# Patient Record
Sex: Male | Born: 1960 | Race: White | Hispanic: No | Marital: Single | Smoking: Never smoker
Health system: Southern US, Community
[De-identification: ages and names within clinical notes are randomized; demographics above are authoritative.]

## PROBLEM LIST (undated history)

## (undated) DIAGNOSIS — F1020 Alcohol dependence, uncomplicated: Secondary | ICD-10-CM

## (undated) HISTORY — PX: ANKLE SURGERY: SHX546

---

## 1999-12-04 ENCOUNTER — Emergency Department (HOSPITAL_COMMUNITY): Admission: EM | Admit: 1999-12-04 | Discharge: 1999-12-04 | Payer: Self-pay | Admitting: Emergency Medicine

## 1999-12-04 ENCOUNTER — Encounter: Payer: Self-pay | Admitting: Emergency Medicine

## 2000-06-26 ENCOUNTER — Encounter: Payer: Self-pay | Admitting: Emergency Medicine

## 2000-06-26 ENCOUNTER — Emergency Department (HOSPITAL_COMMUNITY): Admission: EM | Admit: 2000-06-26 | Discharge: 2000-06-26 | Payer: Self-pay | Admitting: Emergency Medicine

## 2002-03-19 ENCOUNTER — Emergency Department (HOSPITAL_COMMUNITY): Admission: EM | Admit: 2002-03-19 | Discharge: 2002-03-20 | Payer: Self-pay | Admitting: Emergency Medicine

## 2004-08-15 ENCOUNTER — Encounter: Admission: RE | Admit: 2004-08-15 | Discharge: 2004-08-15 | Payer: Self-pay | Admitting: Internal Medicine

## 2004-08-18 ENCOUNTER — Encounter: Admission: RE | Admit: 2004-08-18 | Discharge: 2004-08-18 | Payer: Self-pay | Admitting: Internal Medicine

## 2008-05-15 ENCOUNTER — Ambulatory Visit: Payer: Self-pay | Admitting: Internal Medicine

## 2008-05-15 ENCOUNTER — Ambulatory Visit (HOSPITAL_COMMUNITY): Admission: RE | Admit: 2008-05-15 | Discharge: 2008-05-15 | Payer: Self-pay | Admitting: Family Medicine

## 2008-05-28 ENCOUNTER — Ambulatory Visit: Payer: Self-pay | Admitting: Internal Medicine

## 2008-08-09 ENCOUNTER — Ambulatory Visit: Payer: Self-pay | Admitting: Internal Medicine

## 2008-08-13 ENCOUNTER — Ambulatory Visit: Payer: Self-pay | Admitting: *Deleted

## 2009-02-13 ENCOUNTER — Ambulatory Visit: Payer: Self-pay | Admitting: Internal Medicine

## 2009-02-19 ENCOUNTER — Ambulatory Visit (HOSPITAL_COMMUNITY): Admission: RE | Admit: 2009-02-19 | Discharge: 2009-02-19 | Payer: Self-pay | Admitting: Internal Medicine

## 2009-03-11 ENCOUNTER — Encounter: Admission: RE | Admit: 2009-03-11 | Discharge: 2009-05-21 | Payer: Self-pay | Admitting: Internal Medicine

## 2009-04-19 ENCOUNTER — Emergency Department (HOSPITAL_COMMUNITY): Admission: EM | Admit: 2009-04-19 | Discharge: 2009-04-19 | Payer: Self-pay | Admitting: Emergency Medicine

## 2009-05-01 ENCOUNTER — Ambulatory Visit: Payer: Self-pay | Admitting: Internal Medicine

## 2009-05-14 ENCOUNTER — Inpatient Hospital Stay (HOSPITAL_COMMUNITY): Admission: RE | Admit: 2009-05-14 | Discharge: 2009-05-15 | Payer: Self-pay | Admitting: Orthopaedic Surgery

## 2009-08-15 ENCOUNTER — Ambulatory Visit: Payer: Self-pay | Admitting: Internal Medicine

## 2009-08-23 ENCOUNTER — Encounter: Admission: RE | Admit: 2009-08-23 | Discharge: 2009-10-02 | Payer: Self-pay | Admitting: Internal Medicine

## 2009-09-13 ENCOUNTER — Ambulatory Visit: Payer: Self-pay | Admitting: Internal Medicine

## 2009-10-08 ENCOUNTER — Ambulatory Visit: Payer: Self-pay | Admitting: Internal Medicine

## 2009-10-28 ENCOUNTER — Encounter: Admission: RE | Admit: 2009-10-28 | Discharge: 2009-11-14 | Payer: Self-pay | Admitting: Internal Medicine

## 2009-12-26 ENCOUNTER — Emergency Department (HOSPITAL_COMMUNITY): Admission: EM | Admit: 2009-12-26 | Discharge: 2009-12-26 | Payer: Self-pay | Admitting: Emergency Medicine

## 2010-10-25 ENCOUNTER — Encounter: Payer: Self-pay | Admitting: Internal Medicine

## 2011-01-10 LAB — CBC
MCHC: 34.1 g/dL (ref 30.0–36.0)
MCV: 93.8 fL (ref 78.0–100.0)
Platelets: 260 10*3/uL (ref 150–400)
RBC: 4.61 MIL/uL (ref 4.22–5.81)
RDW: 15 % (ref 11.5–15.5)
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
## 2011-05-15 ENCOUNTER — Ambulatory Visit: Payer: Self-pay | Attending: Family Medicine | Admitting: Physical Therapy

## 2011-05-15 DIAGNOSIS — M25519 Pain in unspecified shoulder: Secondary | ICD-10-CM | POA: Insufficient documentation

## 2011-05-15 DIAGNOSIS — M6281 Muscle weakness (generalized): Secondary | ICD-10-CM | POA: Insufficient documentation

## 2011-05-15 DIAGNOSIS — IMO0001 Reserved for inherently not codable concepts without codable children: Secondary | ICD-10-CM | POA: Insufficient documentation

## 2011-05-15 DIAGNOSIS — M25619 Stiffness of unspecified shoulder, not elsewhere classified: Secondary | ICD-10-CM | POA: Insufficient documentation

## 2011-05-20 ENCOUNTER — Ambulatory Visit: Payer: Self-pay | Admitting: Physical Therapy

## 2011-06-02 ENCOUNTER — Encounter: Payer: Self-pay | Admitting: Physical Therapy

## 2011-06-03 ENCOUNTER — Encounter: Payer: Self-pay | Admitting: Physical Therapy

## 2011-06-09 ENCOUNTER — Encounter: Payer: Self-pay | Admitting: Physical Therapy

## 2011-06-12 ENCOUNTER — Encounter: Payer: Self-pay | Admitting: Physical Therapy

## 2011-06-13 ENCOUNTER — Emergency Department (HOSPITAL_COMMUNITY): Payer: Self-pay

## 2011-06-13 ENCOUNTER — Emergency Department (HOSPITAL_COMMUNITY)
Admission: EM | Admit: 2011-06-13 | Discharge: 2011-06-13 | Disposition: A | Discharge status: Home / Self Care | Payer: Self-pay | Attending: Emergency Medicine | Admitting: Emergency Medicine

## 2011-06-13 DIAGNOSIS — M25429 Effusion, unspecified elbow: Secondary | ICD-10-CM | POA: Insufficient documentation

## 2011-06-13 DIAGNOSIS — M25529 Pain in unspecified elbow: Secondary | ICD-10-CM | POA: Insufficient documentation

## 2011-06-13 LAB — URIC ACID: Uric Acid, Serum: 8 mg/dL — ABNORMAL HIGH (ref 4.0–7.8)

## 2011-10-26 ENCOUNTER — Ambulatory Visit (INDEPENDENT_AMBULATORY_CARE_PROVIDER_SITE_OTHER): Payer: PRIVATE HEALTH INSURANCE

## 2011-10-26 DIAGNOSIS — S7010XA Contusion of unspecified thigh, initial encounter: Secondary | ICD-10-CM

## 2011-10-26 DIAGNOSIS — M79609 Pain in unspecified limb: Secondary | ICD-10-CM

## 2013-01-05 ENCOUNTER — Encounter (HOSPITAL_COMMUNITY): Payer: Self-pay

## 2013-01-05 ENCOUNTER — Emergency Department (HOSPITAL_COMMUNITY)
Admission: EM | Admit: 2013-01-05 | Discharge: 2013-01-05 | Disposition: A | Discharge status: Home / Self Care | Payer: Self-pay | Attending: Emergency Medicine | Admitting: Emergency Medicine

## 2013-01-05 DIAGNOSIS — R040 Epistaxis: Secondary | ICD-10-CM | POA: Insufficient documentation

## 2013-01-05 NOTE — ED Notes (Signed)
No hx of trauma. No blood thinners. No bleeding at present.

## 2013-01-05 NOTE — ED Provider Notes (Signed)
History    This chart was scribed for Jeannetta Ellis, PA-C working with Loren Racer, MD by Melba Coon, ED Scribe. This patient was seen in room TR04C/TR04C and the patient's care was started at 5:20PM.   CSN: [id number]  Arrival date & time 01/05/13  1604   None     Chief Complaint  Patient presents with  . Epistaxis    (Consider location/radiation/quality/duration/timing/severity/associated sxs/prior treatment) The history is provided by the patient. No language interpreter was used.   Pedro Mitchell is a 52 y.o. male who presents to the Emergency Department complaining of episodic, moderate epistaxis with an onset yesterday around 5:30PM (about 24 hours ago). He reports a history of epistaxis about 7 years ago; he had to go the ED then and get packing; he reports it happened around allergy season in April/May when he was sneezing. Recently, he reports he was walking home around onset when his nose began bleeding and has been bleeding intermittently since yesterday and throughout the day today. He reports bleeding coming out of the nose with throat drainage. He called EMS yesterday and had something sprayed into his nose which stopped the bleeding after bleeding for 25-30 min; but he did not want EMS to bring him in at that time. He reports his nose bled 3 times today. He called EMS today and had something sprayed into his nose which stopped the bleeding after bleeding for over an hour; but he did not want EMS to bring him in at that time. However, he changed his mind and presented here to the ED. He does not present with active bleeding at this time. He denies lightheadedness, dizziness, and weakness. He denies any history of blood disorders. Denies HA, fever, neck pain, sore throat, rash, back pain, CP, SOB, abdominal pain, nausea, emesis, diarrhea, dysuria, or extremity pain, edema, weakness, numbness, or tingling. He is not on any blood thinners. No known allergies. No other  pertinent medical symptoms.  History reviewed. No pertinent past medical history.  Past Surgical History  Procedure Laterality Date  . Ankle surgery      No family history on file.  History  Substance Use Topics  . Smoking status: Never Smoker   . Smokeless tobacco: Not on file  . Alcohol Use: Yes      Review of Systems  HENT: Positive for nosebleeds.    10 Systems reviewed and all are negative for acute change except as noted in the HPI.   Allergies  Review of patient's allergies indicates no known allergies.  Home Medications  No current outpatient prescriptions on file.  BP 130/85  Pulse 84  Temp(Src) 98.1 F (36.7 C) (Oral)  Resp 18  SpO2 97%  Physical Exam  Nursing note and vitals reviewed. Constitutional: He is oriented to person, place, and time. He appears well-developed and well-nourished. No distress.  HENT:  Head: Normocephalic and atraumatic.  Mouth/Throat: Oropharynx is clear and moist. No oropharyngeal exudate.  Nose: Dried blood present at the bottom of bilateral nares. No active bleeding.  Eyes: EOM are normal. Pupils are equal, round, and reactive to light.  Neck: Normal range of motion. Neck supple. No tracheal deviation present.  Cardiovascular: Normal rate, regular rhythm and normal heart sounds.   No murmur heard. Pulmonary/Chest: Effort normal and breath sounds normal. No respiratory distress. He has no wheezes. He has no rales. He exhibits no tenderness.  Abdominal: Soft. Bowel sounds are normal. He exhibits no distension and no mass. There is no  tenderness.  Musculoskeletal: Normal range of motion.  Neurological: He is alert and oriented to person, place, and time.  Skin: Skin is warm and dry. No rash noted.  Psychiatric: He has a normal mood and affect. His behavior is normal.    ED Course  Procedures (including critical care time)  DIAGNOSTIC STUDIES: Oxygen Saturation is 97% on room air, normal by my interpretation.     COORDINATION OF CARE:  5:31PM - Will discuss case with attending physician. 5:50PM - Will refer to ENT specialist. Vitals and condition are stable at this time. He is ready for d/c.   Labs Reviewed - No data to display No results found.   1. Bleeding nose       MDM  Pt is a 52 yo M presenting for episodic anterior nosebleeds that began yesterday. No associated nausea, vomiting, headache, lightheadedness. No blood disorders, coagulopathies, or malignancies in patient history. Not actively taking blood thinners. Patient not actively bleeding in ED. PE unremarkable. No signs of posterior bleed. Patient advised to follow up with ENT referral in the next day or two for further evaluation. Given proper instructions on controlling a nose bleed. Given return precautions. Patient d/w with Dr. Ranae Palms, agrees with plan. Patient agreeable to plan. Patient is stable at time of discharge    I personally performed the services described in this documentation, which was scribed in my presence. The recorded information has been reviewed and is accurate.        Jeannetta Ellis, PA-C 01/06/13 [phone]
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
## 2013-01-06 NOTE — ED Provider Notes (Signed)
Medical screening examination/treatment/procedure(s) were performed by non-physician practitioner and as supervising physician I was immediately available for consultation/collaboration.   Shekia Kuper, MD 01/06/13 2348 

## 2014-04-02 ENCOUNTER — Emergency Department (HOSPITAL_COMMUNITY)
Admission: EM | Admit: 2014-04-02 | Discharge: 2014-04-02 | Disposition: A | Discharge status: Home / Self Care | Payer: Self-pay | Attending: Emergency Medicine | Admitting: Emergency Medicine

## 2014-04-02 ENCOUNTER — Emergency Department (HOSPITAL_COMMUNITY): Payer: Self-pay

## 2014-04-02 ENCOUNTER — Encounter (HOSPITAL_COMMUNITY): Payer: Self-pay | Admitting: Emergency Medicine

## 2014-04-02 DIAGNOSIS — Y92009 Unspecified place in unspecified non-institutional (private) residence as the place of occurrence of the external cause: Secondary | ICD-10-CM | POA: Insufficient documentation

## 2014-04-02 DIAGNOSIS — S79919A Unspecified injury of unspecified hip, initial encounter: Secondary | ICD-10-CM | POA: Insufficient documentation

## 2014-04-02 DIAGNOSIS — IMO0002 Reserved for concepts with insufficient information to code with codable children: Secondary | ICD-10-CM | POA: Insufficient documentation

## 2014-04-02 DIAGNOSIS — S79929A Unspecified injury of unspecified thigh, initial encounter: Principal | ICD-10-CM

## 2014-04-02 DIAGNOSIS — M545 Low back pain, unspecified: Secondary | ICD-10-CM

## 2014-04-02 DIAGNOSIS — R296 Repeated falls: Secondary | ICD-10-CM | POA: Insufficient documentation

## 2014-04-02 DIAGNOSIS — W19XXXA Unspecified fall, initial encounter: Secondary | ICD-10-CM

## 2014-04-02 DIAGNOSIS — Y9389 Activity, other specified: Secondary | ICD-10-CM | POA: Insufficient documentation

## 2014-04-02 DIAGNOSIS — M25552 Pain in left hip: Secondary | ICD-10-CM

## 2014-04-02 MED ORDER — METHOCARBAMOL 500 MG PO TABS: 500.0000 mg | ORAL_TABLET | Freq: Two times a day (BID) | ORAL | Status: DC

## 2014-04-02 MED ORDER — OXYCODONE-ACETAMINOPHEN 5-325 MG PO TABS: 1.0000 | ORAL_TABLET | Freq: Four times a day (QID) | ORAL | Status: DC | PRN

## 2014-04-02 MED ORDER — METHOCARBAMOL 500 MG PO TABS
500.0000 mg | ORAL_TABLET | Freq: Once | ORAL | Status: AC
  Administered 2014-04-02: 500 mg via ORAL
  Filled 2014-04-02: qty 1

## 2014-04-02 MED ORDER — OXYCODONE-ACETAMINOPHEN 5-325 MG PO TABS
2.0000 | ORAL_TABLET | Freq: Once | ORAL | Status: AC
  Administered 2014-04-02: 2 via ORAL
  Filled 2014-04-02: qty 2

## 2014-04-02 MED ORDER — NAPROXEN 500 MG PO TABS: 500.0000 mg | ORAL_TABLET | Freq: Two times a day (BID) | ORAL | Status: DC

## 2014-04-02 NOTE — Discharge Instructions (Signed)
Take muscle relaxer and pain medication as needed.  Do not drive or operate heavy machinery for 4-6 hours after taking medication.

## 2014-04-02 NOTE — ED Provider Notes (Signed)
CSN: [id number]     Arrival date & time 04/02/14  0906 History  This chart was scribed for a non-physician practitioner, Santiago GladHeather Laisure PA,, working with Merrie RoofJohn David Wofford III, * by Julian HyMorgan Graham, ED Scribe. [facility] The patient's care was started at 9:57 AM.   Chief Complaint  Patient presents with  . Hip Pain    The history is provided by the patient. No language interpreter was used.   HPI Comments: Pedro Mitchell is a 53 y.o. male who presents to the Emergency Department complaining of constant, unchanged left hip pain resulting from a fall that occurred 8 days ago. Patient states he fell from his porch, about 1 foot off of the ground, into some bushes. Patient states he landed on his left side and noticed bruising appear the following day. He complains of constant, non-radiating left hip pain with intermittent episodes of sharp, "catching" pains. He states the pain is worse with movement and ambulation. He has taken Tylenol with minimal relief. Patient denies hematuria, vomiting, or abdominal pain.  He is not on any anticoagulants.  History reviewed. No pertinent past medical history. Past Surgical History  Procedure Laterality Date  . Ankle surgery     No family history on file. History  Substance Use Topics  . Smoking status: Never Smoker   . Smokeless tobacco: Not on file  . Alcohol Use: Yes    Review of Systems  Respiratory: Negative for shortness of breath.   Gastrointestinal: Negative for nausea, vomiting and abdominal pain.  Genitourinary: Negative for hematuria.  Musculoskeletal: Positive for arthralgias (left hip) and back pain.  Skin: Positive for wound (bruising to back).  Neurological: Negative for weakness and numbness.  All other systems reviewed and are negative.  Allergies  Review of patient's allergies indicates no known allergies.  Home Medications   Prior to Admission medications   Medication Sig Start Date End Date  Taking? Authorizing Rifky Lapre  acetaminophen (TYLENOL) 325 MG tablet Take 650 mg by mouth every 6 (six) hours as needed for mild pain.   Yes Historical Milik Gilreath, MD   Triage Vitals: BP 119/87  Pulse 69  Temp(Src) 97.5 F (36.4 C) (Oral)  Resp 18  Ht 6\' 5"  (1.956 m)  Wt 195 lb (88.451 kg)  BMI 23.12 kg/m2  SpO2 100% Physical Exam  Nursing note and vitals reviewed. Constitutional: He is oriented to person, place, and time. He appears well-developed and well-nourished. No distress.  HENT:  Head: Normocephalic and atraumatic.  Eyes: Conjunctivae and EOM are normal.  Neck: Neck supple. No tracheal deviation present.  Cardiovascular: Normal rate, regular rhythm and normal heart sounds.   Pulses:      Dorsalis pedis pulses are 2+ on the right side.  2+ DP pulse bilaterally  Pulmonary/Chest: Effort normal and breath sounds normal. No respiratory distress.  No ecchymosis to the chest.    Abdominal: Soft. There is no tenderness.  No ecchymosis to the abdomen.   Musculoskeletal:       Left hip: He exhibits decreased range of motion and tenderness.       Lumbar back: He exhibits tenderness.  Ecchomisis of left upper, mid, and lower back. Tenderness to palpatation over these areas. No hematoma present. Tenderness to palpation to lumbar spine. No step offs or deformities. Distal sensation of both feet is intact. Patient ambulates with a limp favoring the right side.   Pain with flexion, extension, adduction, abduction of left hip. No obvious ecchymosis or  swelling of left hip. Tenderness to palpation of left hip.   Neurological: He is alert and oriented to person, place, and time.  Skin: Skin is warm and dry.  Psychiatric: He has a normal mood and affect. His behavior is normal.    ED Course  Procedures (including critical care time)  DIAGNOSTIC STUDIES: Oxygen Saturation is 100% on room air, normal by my interpretation.    COORDINATION OF CARE: 10:00 AM- Will order x-rays of left hip  and lumbar spine. Patient informed of current plan for treatment and evaluation and agrees with plan at this time.  Labs Review Labs Reviewed - No data to display  Imaging Review Dg Lumbar Spine Complete  04/02/2014   CLINICAL DATA:  Fall 1 week ago.  Low back pain.  EXAM: LUMBAR SPINE - COMPLETE 4+ VIEW  COMPARISON:  Plain films lumbar spine 12/26/2009.  FINDINGS: There is no evidence of lumbar spine fracture. Alignment is normal. Intervertebral disc spaces are maintained.  IMPRESSION: Negative exam.   Electronically Signed   By: Drusilla Kannerhomas  Dalessio M.D.   On: 04/02/2014 10:56   Dg Hip Complete Left  04/02/2014   CLINICAL DATA:  Status post fall 1 week ago.  Left hip pain.  EXAM: LEFT HIP - COMPLETE 2+ VIEW  COMPARISON:  None.  FINDINGS: No acute bony or joint abnormality is identified. Spurring at the lesser trochanter is incidentally noted. No notable degenerative change about the hips is seen.  IMPRESSION: No acute abnormality.   Electronically Signed   By: Drusilla Kannerhomas  Dalessio M.D.   On: 04/02/2014 10:55     EKG Interpretation None     11:10 AM Patient reports that his pain has significantly improved.   MDM   Final diagnoses:  None   Patient presenting with pain of his left hip and back that has been present since falling 8 days ago.  Patient hemodynamically stable.  Xrays negative.  Patient is ambulatory.  Feel that the patient is stable for discharge.  Return precautions given.  I personally performed the services described in this documentation, which was scribed in my presence. The recorded information has been reviewed and is accurate.     Santiago GladHeather Laisure, PA-C 04/03/14 2353

## 2014-04-02 NOTE — ED Notes (Signed)
Patient states he slipped off porch into some bushes approximately 8 days ago.  Patient complains of L hip pain.   Patient states he has been walking on the hip since accident.   Patient also has significant amount of bruising to L back.

## 2014-04-03 NOTE — Discharge Planning (Signed)
[facility] Liaison was not able to see patient, [facility] orange card information and primary care resource guide will be mailed to the address listed °

## 2014-04-04 NOTE — ED Provider Notes (Signed)
Medical screening examination/treatment/procedure(s) were performed by non-physician practitioner and as supervising physician I was immediately available for consultation/collaboration.   John David Wofford III, MD 04/04/14 1037 

## 2014-07-23 ENCOUNTER — Ambulatory Visit: Payer: Self-pay

## 2014-08-22 ENCOUNTER — Ambulatory Visit: Payer: Self-pay

## 2019-05-19 ENCOUNTER — Emergency Department (HOSPITAL_COMMUNITY)
Admission: EM | Admit: 2019-05-19 | Discharge: 2019-05-19 | Disposition: A | Discharge status: Home / Self Care | Payer: Self-pay | Attending: Emergency Medicine | Admitting: Emergency Medicine

## 2019-05-19 ENCOUNTER — Other Ambulatory Visit: Payer: Self-pay

## 2019-05-19 ENCOUNTER — Emergency Department (HOSPITAL_COMMUNITY): Payer: Self-pay

## 2019-05-19 DIAGNOSIS — Y904 Blood alcohol level of 80-99 mg/100 ml: Secondary | ICD-10-CM | POA: Insufficient documentation

## 2019-05-19 DIAGNOSIS — S52022A Displaced fracture of olecranon process without intraarticular extension of left ulna, initial encounter for closed fracture: Secondary | ICD-10-CM

## 2019-05-19 DIAGNOSIS — Y999 Unspecified external cause status: Secondary | ICD-10-CM | POA: Insufficient documentation

## 2019-05-19 DIAGNOSIS — F1010 Alcohol abuse, uncomplicated: Secondary | ICD-10-CM

## 2019-05-19 DIAGNOSIS — Z23 Encounter for immunization: Secondary | ICD-10-CM | POA: Insufficient documentation

## 2019-05-19 DIAGNOSIS — S5002XA Contusion of left elbow, initial encounter: Secondary | ICD-10-CM

## 2019-05-19 DIAGNOSIS — Y92811 Bus as the place of occurrence of the external cause: Secondary | ICD-10-CM | POA: Insufficient documentation

## 2019-05-19 DIAGNOSIS — Y9389 Activity, other specified: Secondary | ICD-10-CM | POA: Insufficient documentation

## 2019-05-19 DIAGNOSIS — W19XXXA Unspecified fall, initial encounter: Secondary | ICD-10-CM

## 2019-05-19 DIAGNOSIS — S80211A Abrasion, right knee, initial encounter: Secondary | ICD-10-CM | POA: Insufficient documentation

## 2019-05-19 DIAGNOSIS — Z79899 Other long term (current) drug therapy: Secondary | ICD-10-CM | POA: Insufficient documentation

## 2019-05-19 LAB — CBC WITH DIFFERENTIAL/PLATELET
Abs Immature Granulocytes: 0 10*3/uL (ref 0.00–0.07)
Basophils Absolute: 0 10*3/uL (ref 0.0–0.1)
Basophils Relative: 0 %
Eosinophils Absolute: 0 10*3/uL (ref 0.0–0.5)
Eosinophils Relative: 0 %
HCT: 35.7 % — ABNORMAL LOW (ref 39.0–52.0)
Hemoglobin: 12 g/dL — ABNORMAL LOW (ref 13.0–17.0)
Lymphocytes Relative: 12 %
Lymphs Abs: 1.6 10*3/uL (ref 0.7–4.0)
MCH: 32.3 pg (ref 26.0–34.0)
MCHC: 33.6 g/dL (ref 30.0–36.0)
MCV: 96 fL (ref 80.0–100.0)
Monocytes Absolute: 3.4 10*3/uL — ABNORMAL HIGH (ref 0.1–1.0)
Monocytes Relative: 25 %
Neutro Abs: 8.5 10*3/uL — ABNORMAL HIGH (ref 1.7–7.7)
Neutrophils Relative %: 63 %
Platelets: 130 10*3/uL — ABNORMAL LOW (ref 150–400)
RBC: 3.72 MIL/uL — ABNORMAL LOW (ref 4.22–5.81)
RDW: 12.7 % (ref 11.5–15.5)
WBC: 13.5 10*3/uL — ABNORMAL HIGH (ref 4.0–10.5)
nRBC: 0 % (ref 0.0–0.2)
nRBC: 0 /100 WBC

## 2019-05-19 LAB — PROTIME-INR
INR: 1.1 (ref 0.8–1.2)
Prothrombin Time: 14.3 seconds (ref 11.4–15.2)

## 2019-05-19 LAB — COMPREHENSIVE METABOLIC PANEL
ALT: 79 U/L — ABNORMAL HIGH (ref 0–44)
AST: 184 U/L — ABNORMAL HIGH (ref 15–41)
Albumin: 4.7 g/dL (ref 3.5–5.0)
Alkaline Phosphatase: 81 U/L (ref 38–126)
Anion gap: 18 — ABNORMAL HIGH (ref 5–15)
BUN: <5 mg/dL — ABNORMAL LOW (ref 6–20)
CO2: 20 mmol/L — ABNORMAL LOW (ref 22–32)
Calcium: 9.5 mg/dL (ref 8.9–10.3)
Chloride: 93 mmol/L — ABNORMAL LOW (ref 98–111)
Creatinine, Ser: 0.84 mg/dL (ref 0.61–1.24)
GFR calc Af Amer: >60 mL/min (ref >60)
GFR calc non Af Amer: >60 mL/min (ref >60)
Glucose, Bld: 109 mg/dL — ABNORMAL HIGH (ref 70–99)
Potassium: 4 mmol/L (ref 3.5–5.1)
Sodium: 131 mmol/L — ABNORMAL LOW (ref 135–145)
Total Bilirubin: 1.6 mg/dL — ABNORMAL HIGH (ref 0.3–1.2)
Total Protein: 8.1 g/dL (ref 6.5–8.1)

## 2019-05-19 LAB — CBG MONITORING, ED: Glucose-Capillary: 133 mg/dL — ABNORMAL HIGH (ref 70–99)

## 2019-05-19 LAB — ETHANOL: Alcohol, Ethyl (B): 80 mg/dL — ABNORMAL HIGH (ref <10)

## 2019-05-19 MED ORDER — LORAZEPAM 1 MG PO TABS
0.0000 mg | ORAL_TABLET | Freq: Four times a day (QID) | ORAL | Status: DC
  Administered 2019-05-19: 19:00:00 1 mg via ORAL
  Filled 2019-05-19: qty 1

## 2019-05-19 MED ORDER — THIAMINE HCL 100 MG/ML IJ SOLN
100.0000 mg | Freq: Every day | INTRAMUSCULAR | Status: DC
  Filled 2019-05-19: qty 2

## 2019-05-19 MED ORDER — VITAMIN B-1 100 MG PO TABS
100.0000 mg | ORAL_TABLET | Freq: Every day | ORAL | Status: DC
  Administered 2019-05-19: 100 mg via ORAL
  Filled 2019-05-19: qty 1

## 2019-05-19 MED ORDER — LORAZEPAM 1 MG PO TABS: 0.0000 mg | ORAL_TABLET | Freq: Two times a day (BID) | ORAL | Status: DC

## 2019-05-19 MED ORDER — CEPHALEXIN 500 MG PO CAPS: 500.0000 mg | ORAL_CAPSULE | Freq: Four times a day (QID) | ORAL | 0 refills | Status: DC

## 2019-05-19 MED ORDER — CEFAZOLIN SODIUM-DEXTROSE 1-4 GM/50ML-% IV SOLN
1.0000 g | Freq: Once | INTRAVENOUS | Status: AC
  Administered 2019-05-19: 1 g via INTRAVENOUS
  Filled 2019-05-19: qty 50

## 2019-05-19 MED ORDER — SODIUM CHLORIDE 0.9 % IV BOLUS: 1000.0000 mL | Freq: Once | INTRAVENOUS | Status: DC

## 2019-05-19 MED ORDER — OXYCODONE-ACETAMINOPHEN 5-325 MG PO TABS
1.0000 | ORAL_TABLET | Freq: Once | ORAL | Status: AC
  Administered 2019-05-19: 1 via ORAL
  Filled 2019-05-19: qty 1

## 2019-05-19 MED ORDER — MORPHINE SULFATE (PF) 4 MG/ML IV SOLN
4.0000 mg | Freq: Once | INTRAVENOUS | Status: DC
  Filled 2019-05-19: qty 1

## 2019-05-19 MED ORDER — HYDROCODONE-ACETAMINOPHEN 5-325 MG PO TABS: 1.0000 | ORAL_TABLET | Freq: Four times a day (QID) | ORAL | 0 refills | Status: DC | PRN

## 2019-05-19 MED ORDER — LORAZEPAM 2 MG/ML IJ SOLN
0.0000 mg | Freq: Four times a day (QID) | INTRAMUSCULAR | Status: DC
  Filled 2019-05-19: qty 1

## 2019-05-19 MED ORDER — LORAZEPAM 1 MG PO TABS
1.0000 mg | ORAL_TABLET | Freq: Once | ORAL | Status: AC
  Administered 2019-05-19: 23:00:00 1 mg via ORAL
  Filled 2019-05-19: qty 1

## 2019-05-19 MED ORDER — TETANUS-DIPHTH-ACELL PERTUSSIS 5-2.5-18.5 LF-MCG/0.5 IM SUSP
0.5000 mL | Freq: Once | INTRAMUSCULAR | Status: AC
  Administered 2019-05-19: 21:00:00 0.5 mL via INTRAMUSCULAR
  Filled 2019-05-19: qty 0.5

## 2019-05-19 MED ORDER — IBUPROFEN 600 MG PO TABS: 600.0000 mg | ORAL_TABLET | Freq: Four times a day (QID) | ORAL | 0 refills | Status: DC | PRN

## 2019-05-19 MED ORDER — ONDANSETRON HCL 4 MG/2ML IJ SOLN
4.0000 mg | Freq: Once | INTRAMUSCULAR | Status: DC
  Filled 2019-05-19: qty 2

## 2019-05-19 MED ORDER — LORAZEPAM 2 MG/ML IJ SOLN: 0.0000 mg | Freq: Two times a day (BID) | INTRAMUSCULAR | Status: DC
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
## 2019-05-19 NOTE — Discharge Instructions (Signed)
Please take antibiotics as directed to help prevent skin infection over your abrasion.  Use ibuprofen every 6 hours for pain, Norco as needed for additional pain.  It is extremely important that you elevate your elbow as much as possible to help reduce swelling, you can apply ice as well.  Please call Monday morning to schedule follow-up with Dr. Marcelino Scot with orthopedics.  If your hand becomes numb, discolored you have significantly worsening pain please return to the emergency department.

## 2019-05-19 NOTE — ED Notes (Signed)
BS 132

## 2019-05-19 NOTE — ED Notes (Signed)
IV team here to start IV 

## 2019-05-19 NOTE — ED Provider Notes (Signed)
[name]   CSN: [id number] Arrival date & time: 05/19/19  1423    History   Chief Complaint Chief Complaint  Patient presents with   Arm Injury   Leg Injury    HPI Andre LefortKeenan [facility] is a 58 y.o. male.     Andre LefortKeenan [facility] is a 58 y.o. male with a history of alcohol abuse, otherwise healthy, who presents to the emergency department for evaluation of elbow and right knee injury.  Patient reports yesterday while getting off of a public transportation bus he missed a step and fell striking his left elbow on the ground and landing on his right knee.  Since fall yesterday patient has had significant swelling and hematoma to the left elbow.  He reports severe pain with movement of his left elbow.  He has an abrasion to the left elbow but no open laceration.  Denies any bleeding.  He reports swelling has extended into his left forearm, he denies any numbness or tingling.  Able to move fingers.  He also reports an abrasion and bruise to his right knee although reports he has been ambulatory on the knee with mild discomfort.  Patient is not on any blood thinners, denies any previous issues with bruising or bleeding.  Does report daily alcohol use, typically drinks a sixpack every day, has not had anything to drink since last night.  Did not hit his head during fall, no neck or back pain.  No other injuries.     No past medical history on file.  There are no active problems to display for this patient.   Past Surgical History:  Procedure Laterality Date   ANKLE SURGERY          Home Medications    Prior to Admission medications   Medication Sig Start Date End Date Taking? Authorizing Provider  acetaminophen (TYLENOL) 325 MG tablet Take 650 mg by mouth every 6 (six) hours as needed for mild pain.    [provider]  cephALEXin (KEFLEX) 500 MG capsule Take 1 capsule (500 mg total) by mouth 4 (four) times daily. 05/19/19    Dartha LodgeFord, Tira Lafferty N, PA-C  HYDROcodone-acetaminophen (NORCO) 5-325 MG tablet Take 1 tablet by mouth every 6 (six) hours as needed. 05/19/19   Dartha LodgeFord, Ajamu Maxon N, PA-C  ibuprofen (ADVIL) 600 MG tablet Take 1 tablet (600 mg total) by mouth every 6 (six) hours as needed. 05/19/19   Dartha LodgeFord, Henrique Parekh N, PA-C  methocarbamol (ROBAXIN) 500 MG tablet Take 1 tablet (500 mg total) by mouth 2 (two) times daily. 04/02/14   Santiago GladLaisure, Heather, PA-C  naproxen (NAPROSYN) 500 MG tablet Take 1 tablet (500 mg total) by mouth 2 (two) times daily. 04/02/14   Santiago GladLaisure, Heather, PA-C  oxyCODONE-acetaminophen (PERCOCET/ROXICET) 5-325 MG per tablet Take 1-2 tablets by mouth every 6 (six) hours as needed for severe pain. 04/02/14   Santiago GladLaisure, Heather, PA-C    Family History No family history on file.  Social History Social History   Tobacco Use   Smoking status: Never Smoker  Substance Use Topics   Alcohol use: Yes   Drug use: No     Allergies   Patient has no known allergies.   Review of Systems Review of Systems  Constitutional: Negative for chills and fever.  HENT: Negative.   Respiratory: Negative for cough and shortness of breath.   Cardiovascular: Negative for chest pain.  Gastrointestinal: Negative for abdominal pain, nausea and vomiting.  Musculoskeletal: Positive for  arthralgias and joint swelling. Negative for myalgias.  Skin: Positive for wound. Negative for color change and rash.  Neurological: Negative for dizziness, syncope, weakness, light-headedness and numbness.     Physical Exam Updated Vital Signs BP (!) 165/95    Pulse (!) 105    Temp 98.3 F (36.8 C) (Oral)    Resp 18    SpO2 100%   Physical Exam Vitals signs and nursing note reviewed.  Constitutional:      General: He is not in acute distress.    Appearance: Normal appearance. He is well-developed and normal weight. He is not ill-appearing or diaphoretic.     Comments: Patient is alert, mildly tremulous but able to answer all questions  appropriately.  HENT:     Head: Normocephalic and atraumatic.     Comments: No evidence of head trauma, no palpable hematoma, ecchymosis, abrasion or appreciable step-off or deformity. Eyes:     General:        Right eye: No discharge.        Left eye: No discharge.  Neck:     Musculoskeletal: Neck supple.     Comments: No midline C-spine tenderness. Cardiovascular:     Rate and Rhythm: Regular rhythm. Tachycardia present.     Heart sounds: Normal heart sounds. No murmur. No friction rub. No gallop.   Pulmonary:     Effort: Pulmonary effort is normal. No respiratory distress.     Comments: Respirations equal and unlabored, patient able to speak in full sentences, lungs clear to auscultation bilaterally, no tenderness over chest wall Abdominal:     General: Abdomen is flat. Bowel sounds are normal. There is no distension.     Palpations: Abdomen is soft. There is no mass.     Tenderness: There is no abdominal tenderness. There is no guarding.     Comments: Abdomen soft, no ecchymosis, nontender to palpation.  Musculoskeletal:     Comments: Left elbow with significant hematoma and swelling extending up to the mid upper arm and to the mid forearm.  There is an abrasion over the olecranon of the elbow but there is no open laceration.  Bruising over the olecranon and surrounding posterior elbow.  2+ radial pulse, 5/5 grip strength and cardinal hand movements intact.  Normal sensation throughout the left upper extremity.  No tenderness swelling or pain at the shoulder or wrist. Right knee with some tenderness, abrasion and ecchymosis but no swelling or palpable bony deformity. Distal pulses intact, ambulatory without difficulty.  Skin:    General: Skin is warm and dry.  Neurological:     Mental Status: He is alert and oriented to person, place, and time.     Coordination: Coordination normal.  Psychiatric:        Mood and Affect: Mood normal.        Behavior: Behavior normal.      ED  Treatments / Results  Labs (all labs ordered are listed, but only abnormal results are displayed) Labs Reviewed  COMPREHENSIVE METABOLIC PANEL - Abnormal; Notable for the following components:      Result Value   Sodium 131 (*)    Chloride 93 (*)    CO2 20 (*)    Glucose, Bld 109 (*)    BUN <5 (*)    AST 184 (*)    ALT 79 (*)    Total Bilirubin 1.6 (*)    Anion gap 18 (*)    All other components within normal limits  CBC WITH  DIFFERENTIAL/PLATELET - Abnormal; Notable for the following components:   WBC 13.5 (*)    RBC 3.72 (*)    Hemoglobin 12.0 (*)    HCT 35.7 (*)    Platelets 130 (*)    Neutro Abs 8.5 (*)    Monocytes Absolute 3.4 (*)    All other components within normal limits  ETHANOL - Abnormal; Notable for the following components:   Alcohol, Ethyl (B) 80 (*)    All other components within normal limits  CBG MONITORING, ED - Abnormal; Notable for the following components:   Glucose-Capillary 133 (*)    All other components within normal limits  PROTIME-INR    EKG None  Radiology Dg Elbow Complete Left  Result Date: 05/19/2019 CLINICAL DATA:  Pain after fall EXAM: LEFT ELBOW - COMPLETE 3+ VIEW COMPARISON:  None. FINDINGS: There is a displaced fracture through the olecranon. A resulting joint effusion is identified. Degenerative changes are seen in the elbow. No other abnormalities. IMPRESSION: Displaced olecranon fracture with a joint effusion and overlying soft tissue swelling. Electronically Signed   By: Gerome Sam III M.D   On: 05/19/2019 17:00   Dg Forearm Left  Result Date: 05/19/2019 CLINICAL DATA:  Pain after fall EXAM: LEFT FOREARM - 2 VIEW COMPARISON:  None. FINDINGS: There is a displaced fracture through the olecranon. No other fractures. IMPRESSION: Displaced fracture through the olecranon.  No other abnormalities. Electronically Signed   By: Gerome Sam III M.D   On: 05/19/2019 17:03   Ct Elbow Left Wo Contrast  Result Date:  05/19/2019 CLINICAL DATA:  Elbow fracture EXAM: CT OF THE UPPER LEFT EXTREMITY WITHOUT CONTRAST TECHNIQUE: Multidetector CT imaging of the upper left extremity was performed according to the standard protocol. COMPARISON:  Radiograph same day. FINDINGS: Bones/Joint/Cartilage There is comminuted fracture seen through the olecranon process with intra-articular extension seen through the posterior on the trochlear articulation. There is destruction of the electron process approximately 1.3 cm. There is also comminuted minimally displaced fracture fragment seen at the anterior coronoid process. A portion of the ulna still articulates with the trochlea. The radial head still articulates with the capitellum. Ligaments Suboptimally assessed by CT. Muscles and Tendons There are well corticated ossicle seen at the insertion site of the common flexor tendon, likely calcific tendinosis. There is heterogeneous signal seen at the insertion site of the triceps tendon. The muscles appear to be grossly intact. Soft tissues A moderate elbow joint effusion is seen. There is diffuse subcutaneous edema seen surrounding the posterior elbow. IMPRESSION: 1. Comminuted displaced fracture involving the olecranon process with intra-articular extension of the ulnotrochlear articulation. There is also comminuted fractures of the anterior coronoid process. 2. Heterogeneous signal seen within the triceps insertion which could be partial disruption. 3. Calcific tendinosis of the common flexor tendon. 4. Elbow joint effusion Electronically Signed   By: Jonna Clark M.D.   On: 05/19/2019 22:50   Dg Knee Complete 4 Views Right  Result Date: 05/19/2019 CLINICAL DATA:  Pain after fall EXAM: RIGHT KNEE - COMPLETE 4+ VIEW COMPARISON:  None. FINDINGS: No evidence of fracture, dislocation, or joint effusion. No evidence of arthropathy or other focal bone abnormality. Soft tissues are unremarkable. IMPRESSION: Negative. Electronically Signed   By:  Gerome Sam III M.D   On: 05/19/2019 17:01    Procedures Procedures (including critical care time)  Medications Ordered in ED Medications  sodium chloride 0.9 % bolus 1,000 mL (has no administration in time range)  ondansetron (ZOFRAN) injection 4  mg (has no administration in time range)  LORazepam (ATIVAN) injection 0-4 mg ( Intravenous See Alternative 05/19/19 1853)    Or  LORazepam (ATIVAN) tablet 0-4 mg (1 mg Oral Given 05/19/19 1853)  LORazepam (ATIVAN) injection 0-4 mg (has no administration in time range)    Or  LORazepam (ATIVAN) tablet 0-4 mg (has no administration in time range)  thiamine (VITAMIN B-1) tablet 100 mg (100 mg Oral Given 05/19/19 1851)    Or  thiamine (B-1) injection 100 mg ( Intravenous See Alternative 05/19/19 1851)  oxyCODONE-acetaminophen (PERCOCET/ROXICET) 5-325 MG per tablet 1 tablet (1 tablet Oral Given 05/19/19 1855)  Tdap (BOOSTRIX) injection 0.5 mL (0.5 mLs Intramuscular Given 05/19/19 2106)  ceFAZolin (ANCEF) IVPB 1 g/50 mL premix (0 g Intravenous Stopped 05/19/19 2221)  LORazepam (ATIVAN) tablet 1 mg (1 mg Oral Given 05/19/19 2240)     Initial Impression / Assessment and Plan / ED Course  I have reviewed the triage vital signs and the nursing notes.  Pertinent labs & imaging results that were available during my care of the patient were reviewed by me and considered in my medical decision making (see chart for details).  58 year old male presents after fall yesterday with significant swelling and hematoma to the left elbow as well as some bruising and pain to the right knee.  On arrival patient is tachycardic and mildly hypertensive vitals otherwise stable, he is mildly tremulous but is alert and responsive.  Does report that he is a daily drinker and has not had anything to drink since yesterday, suspect he may have mild withdrawal, CIWA score of 6 on arrival.  I also wonder if drinking could be contributing to his significant hematoma, will check basic  labs, alcohol level, and INR.  X-rays of the left elbow and right knee were ordered from triage.  Left elbow x-ray shows a displaced olecranon process fracture.  Given significant swelling will get CT of the left elbow.  Strong distal pulse doubt vascular compromise, do not think CTA is indicated.  Knee x-rays negative.  Pain medication ordered, and patient given 1 dose of p.o. Ativan given mild CIWA score.  Labs show mild leukocytosis of 13.5, stable hemoglobin of 12, platelets slightly low at 130 which may explain patient's significant hematoma, but normal INR which is reassuring.  LFTs slightly elevated, no significant electrolyte derangements requiring acute intervention, sodium of 131 which is likely related to chronic alcohol use, anion gap of 18 but with positive ethanol level of 80, likely the cause.  Patient well-appearing and mentating well.  No evidence of head trauma or infectious symptoms.  Left elbow fracture with associated abrasion but no open laceration to make open fracture.  We will plan to get CT and discuss with orthopedics.  Case discussed with Dr. Carola FrostHandy who reviewed patient's imaging, reports he is not entirely surprised by significant hematoma, recommends long-arm splint at 90 degrees with compression from the hand up to the mid upper arm, and stressing the importance of elevation.  Recommends antibiotic coverage for abrasion, he will plan to see the patient in the office early this week, and patient will ultimately need surgical repair.  Dr. Carola FrostHandy aware of patient's platelet count, but does not think he needs admission for immediate repair.  Discussed this plan with patient and he is in agreement, he reports he is feeling much better after splinting and pain medication.  See was score remaining stable, given 1 additional dose of Ativan here in the ED, patient reports he  would like to slowly decrease his drinking but is not planning on stopping drinking right now so we will hold off on  prescribing Librium.  Patient prescribed antibiotics, pain medication.  Stressed the importance of ice and elevation to help with swelling.  Patient to call Dr. Carlean Jews office Monday morning for follow-up.  Strict return precautions discussed.  He expresses understanding and agreement with plan.  Discharged home in good condition.  Patient discussed with Dr. Sedonia Small, who saw patient as well and agrees with plan.   Final Clinical Impressions(s) / ED Diagnoses   Final diagnoses:  Closed fracture of olecranon process of left ulna, initial encounter  Traumatic hematoma of left elbow, initial encounter  Fall, initial encounter  Alcohol abuse    ED Discharge Orders         Ordered    cephALEXin (KEFLEX) 500 MG capsule  4 times daily     05/19/19 2207    HYDROcodone-acetaminophen (NORCO) 5-325 MG tablet  Every 6 hours PRN     05/19/19 2207    ibuprofen (ADVIL) 600 MG tablet  Every 6 hours PRN     05/19/19 2207           Jacqlyn Larsen, PA-C 05/19/19 2342    Maudie Flakes, MD 05/20/19 [phone]

## 2019-05-23 LAB — PATHOLOGIST SMEAR REVIEW

## 2019-06-01 ENCOUNTER — Encounter (HOSPITAL_COMMUNITY): Payer: Self-pay | Admitting: Emergency Medicine

## 2019-06-01 ENCOUNTER — Emergency Department (HOSPITAL_COMMUNITY): Payer: Self-pay

## 2019-06-01 ENCOUNTER — Other Ambulatory Visit: Payer: Self-pay

## 2019-06-01 ENCOUNTER — Inpatient Hospital Stay (HOSPITAL_COMMUNITY)
Admission: EM | Admit: 2019-06-01 | Discharge: 2019-06-03 | DRG: 511 | Disposition: A | Discharge status: Home / Self Care | Payer: Self-pay | Attending: Internal Medicine | Admitting: Internal Medicine

## 2019-06-01 DIAGNOSIS — S52032A Displaced fracture of olecranon process with intraarticular extension of left ulna, initial encounter for closed fracture: Secondary | ICD-10-CM | POA: Diagnosis not present

## 2019-06-01 DIAGNOSIS — Z87891 Personal history of nicotine dependence: Secondary | ICD-10-CM

## 2019-06-01 DIAGNOSIS — Z20828 Contact with and (suspected) exposure to other viral communicable diseases: Secondary | ICD-10-CM | POA: Diagnosis present

## 2019-06-01 DIAGNOSIS — T148XXA Other injury of unspecified body region, initial encounter: Secondary | ICD-10-CM

## 2019-06-01 DIAGNOSIS — F10230 Alcohol dependence with withdrawal, uncomplicated: Secondary | ICD-10-CM | POA: Diagnosis present

## 2019-06-01 DIAGNOSIS — E876 Hypokalemia: Secondary | ICD-10-CM

## 2019-06-01 DIAGNOSIS — S42409A Unspecified fracture of lower end of unspecified humerus, initial encounter for closed fracture: Secondary | ICD-10-CM | POA: Diagnosis present

## 2019-06-01 DIAGNOSIS — S42402A Unspecified fracture of lower end of left humerus, initial encounter for closed fracture: Secondary | ICD-10-CM

## 2019-06-01 DIAGNOSIS — Z596 Low income: Secondary | ICD-10-CM

## 2019-06-01 DIAGNOSIS — Z608 Other problems related to social environment: Secondary | ICD-10-CM | POA: Diagnosis present

## 2019-06-01 DIAGNOSIS — S42402S Unspecified fracture of lower end of left humerus, sequela: Secondary | ICD-10-CM

## 2019-06-01 DIAGNOSIS — Z419 Encounter for procedure for purposes other than remedying health state, unspecified: Secondary | ICD-10-CM

## 2019-06-01 DIAGNOSIS — F1020 Alcohol dependence, uncomplicated: Secondary | ICD-10-CM

## 2019-06-01 DIAGNOSIS — D649 Anemia, unspecified: Secondary | ICD-10-CM | POA: Diagnosis present

## 2019-06-01 HISTORY — DX: Alcohol dependence, uncomplicated: F10.20

## 2019-06-01 LAB — URINALYSIS, ROUTINE W REFLEX MICROSCOPIC
Bilirubin Urine: NEGATIVE
Glucose, UA: NEGATIVE mg/dL
Hgb urine dipstick: NEGATIVE
Ketones, ur: NEGATIVE mg/dL
Leukocytes,Ua: NEGATIVE
Nitrite: NEGATIVE
Protein, ur: NEGATIVE mg/dL
Specific Gravity, Urine: 1.003 — ABNORMAL LOW (ref 1.005–1.030)
pH: 6 (ref 5.0–8.0)

## 2019-06-01 LAB — CBC WITH DIFFERENTIAL/PLATELET
Abs Immature Granulocytes: 0.26 10*3/uL — ABNORMAL HIGH (ref 0.00–0.07)
Basophils Absolute: 0 10*3/uL (ref 0.0–0.1)
Basophils Relative: 0 %
Eosinophils Absolute: 0 10*3/uL (ref 0.0–0.5)
Eosinophils Relative: 0 %
HCT: 32.1 % — ABNORMAL LOW (ref 39.0–52.0)
Hemoglobin: 9.9 g/dL — ABNORMAL LOW (ref 13.0–17.0)
Immature Granulocytes: 2 %
Lymphocytes Relative: 15 %
Lymphs Abs: 1.7 10*3/uL (ref 0.7–4.0)
MCH: 31.2 pg (ref 26.0–34.0)
MCHC: 30.8 g/dL (ref 30.0–36.0)
MCV: 101.3 fL — ABNORMAL HIGH (ref 80.0–100.0)
Monocytes Absolute: 3.1 10*3/uL — ABNORMAL HIGH (ref 0.1–1.0)
Monocytes Relative: 26 %
Neutro Abs: 6.8 10*3/uL (ref 1.7–7.7)
Neutrophils Relative %: 57 %
Platelets: 440 10*3/uL — ABNORMAL HIGH (ref 150–400)
RBC: 3.17 MIL/uL — ABNORMAL LOW (ref 4.22–5.81)
RDW: 13.4 % (ref 11.5–15.5)
WBC: 11.9 10*3/uL — ABNORMAL HIGH (ref 4.0–10.5)
nRBC: 0 % (ref 0.0–0.2)

## 2019-06-01 LAB — COMPREHENSIVE METABOLIC PANEL
ALT: 36 U/L (ref 0–44)
AST: 44 U/L — ABNORMAL HIGH (ref 15–41)
Albumin: 3.4 g/dL — ABNORMAL LOW (ref 3.5–5.0)
Alkaline Phosphatase: 79 U/L (ref 38–126)
Anion gap: 13 (ref 5–15)
BUN: 6 mg/dL (ref 6–20)
CO2: 24 mmol/L (ref 22–32)
Calcium: 8.9 mg/dL (ref 8.9–10.3)
Chloride: 100 mmol/L (ref 98–111)
Creatinine, Ser: 0.78 mg/dL (ref 0.61–1.24)
GFR calc Af Amer: >60 mL/min (ref >60)
GFR calc non Af Amer: >60 mL/min (ref >60)
Glucose, Bld: 96 mg/dL (ref 70–99)
Potassium: 2.9 mmol/L — ABNORMAL LOW (ref 3.5–5.1)
Sodium: 137 mmol/L (ref 135–145)
Total Bilirubin: 1.1 mg/dL (ref 0.3–1.2)
Total Protein: 6.7 g/dL (ref 6.5–8.1)

## 2019-06-01 LAB — PROTIME-INR
INR: 1.1 (ref 0.8–1.2)
Prothrombin Time: 14.2 seconds (ref 11.4–15.2)

## 2019-06-01 LAB — MAGNESIUM: Magnesium: 2.2 mg/dL (ref 1.7–2.4)

## 2019-06-01 LAB — SARS CORONAVIRUS 2 (TAT 6-24 HRS): SARS Coronavirus 2: NEGATIVE

## 2019-06-01 LAB — SURGICAL PCR SCREEN
MRSA, PCR: NEGATIVE
Staphylococcus aureus: NEGATIVE

## 2019-06-01 MED ORDER — LORAZEPAM 1 MG PO TABS: 0.0000 mg | ORAL_TABLET | Freq: Two times a day (BID) | ORAL | Status: DC

## 2019-06-01 MED ORDER — LORAZEPAM 2 MG/ML IJ SOLN
1.0000 mg | Freq: Four times a day (QID) | INTRAMUSCULAR | Status: DC | PRN
  Administered 2019-06-02: 03:00:00 1 mg via INTRAVENOUS
  Filled 2019-06-01: qty 1

## 2019-06-01 MED ORDER — LORAZEPAM 1 MG PO TABS: 0.0000 mg | ORAL_TABLET | Freq: Four times a day (QID) | ORAL | Status: DC

## 2019-06-01 MED ORDER — VITAMIN B-1 100 MG PO TABS
100.0000 mg | ORAL_TABLET | Freq: Every day | ORAL | Status: DC
  Administered 2019-06-02 – 2019-06-03 (×2): 100 mg via ORAL
  Filled 2019-06-01 (×2): qty 1

## 2019-06-01 MED ORDER — NAPROXEN 250 MG PO TABS: 500.0000 mg | ORAL_TABLET | Freq: Two times a day (BID) | ORAL | Status: DC

## 2019-06-01 MED ORDER — DOCUSATE SODIUM 100 MG PO CAPS
100.0000 mg | ORAL_CAPSULE | Freq: Two times a day (BID) | ORAL | Status: DC
  Administered 2019-06-01 – 2019-06-03 (×5): 100 mg via ORAL
  Filled 2019-06-01 (×5): qty 1

## 2019-06-01 MED ORDER — POTASSIUM CHLORIDE IN NACL 20-0.9 MEQ/L-% IV SOLN
INTRAVENOUS | Status: DC
  Administered 2019-06-01 – 2019-06-02 (×2): via INTRAVENOUS
  Filled 2019-06-01 (×3): qty 1000

## 2019-06-01 MED ORDER — POTASSIUM CHLORIDE CRYS ER 20 MEQ PO TBCR
40.0000 meq | EXTENDED_RELEASE_TABLET | Freq: Once | ORAL | Status: AC
  Administered 2019-06-01: 12:00:00 40 meq via ORAL
  Filled 2019-06-01: qty 2

## 2019-06-01 MED ORDER — SODIUM CHLORIDE 0.9% FLUSH
3.0000 mL | Freq: Two times a day (BID) | INTRAVENOUS | Status: DC
  Administered 2019-06-01 – 2019-06-02 (×2): 3 mL via INTRAVENOUS

## 2019-06-01 MED ORDER — POVIDONE-IODINE 10 % EX SWAB: 2.0000 "application " | Freq: Once | CUTANEOUS | Status: DC

## 2019-06-01 MED ORDER — THIAMINE HCL 100 MG/ML IJ SOLN: 100.0000 mg | Freq: Every day | INTRAMUSCULAR | Status: DC

## 2019-06-01 MED ORDER — ONDANSETRON HCL 4 MG PO TABS: 4.0000 mg | ORAL_TABLET | Freq: Four times a day (QID) | ORAL | Status: DC | PRN

## 2019-06-01 MED ORDER — LORAZEPAM 2 MG/ML IJ SOLN: 0.0000 mg | Freq: Two times a day (BID) | INTRAMUSCULAR | Status: DC

## 2019-06-01 MED ORDER — ENSURE PRE-SURGERY PO LIQD
296.0000 mL | Freq: Once | ORAL | Status: AC
  Administered 2019-06-01: 22:00:00 296 mL via ORAL
  Filled 2019-06-01: qty 296

## 2019-06-01 MED ORDER — ADULT MULTIVITAMIN W/MINERALS CH
1.0000 | ORAL_TABLET | Freq: Every day | ORAL | Status: DC
  Administered 2019-06-01 – 2019-06-03 (×3): 1 via ORAL
  Filled 2019-06-01 (×3): qty 1

## 2019-06-01 MED ORDER — ONDANSETRON HCL 4 MG/2ML IJ SOLN: 4.0000 mg | Freq: Four times a day (QID) | INTRAMUSCULAR | Status: DC | PRN

## 2019-06-01 MED ORDER — HYDROCODONE-ACETAMINOPHEN 5-325 MG PO TABS: 1.0000 | ORAL_TABLET | Freq: Four times a day (QID) | ORAL | Status: DC | PRN

## 2019-06-01 MED ORDER — ACETAMINOPHEN 650 MG RE SUPP: 650.0000 mg | Freq: Four times a day (QID) | RECTAL | Status: DC | PRN

## 2019-06-01 MED ORDER — FOLIC ACID 1 MG PO TABS
1.0000 mg | ORAL_TABLET | Freq: Every day | ORAL | Status: DC
  Administered 2019-06-01 – 2019-06-03 (×3): 1 mg via ORAL
  Filled 2019-06-01 (×3): qty 1

## 2019-06-01 MED ORDER — LORAZEPAM 2 MG/ML IJ SOLN: 0.0000 mg | Freq: Four times a day (QID) | INTRAMUSCULAR | Status: DC

## 2019-06-01 MED ORDER — CEFAZOLIN SODIUM-DEXTROSE 2-4 GM/100ML-% IV SOLN
2.0000 g | INTRAVENOUS | Status: AC
  Administered 2019-06-02: 08:00:00 2 g via INTRAVENOUS
  Filled 2019-06-01: qty 100

## 2019-06-01 MED ORDER — CHLORHEXIDINE GLUCONATE 4 % EX LIQD
60.0000 mL | Freq: Once | CUTANEOUS | Status: AC
  Administered 2019-06-02: 4 via TOPICAL
  Filled 2019-06-01: qty 60

## 2019-06-01 MED ORDER — ACETAMINOPHEN 325 MG PO TABS
650.0000 mg | ORAL_TABLET | Freq: Four times a day (QID) | ORAL | Status: DC | PRN
  Administered 2019-06-01: 650 mg via ORAL
  Filled 2019-06-01: qty 2

## 2019-06-01 MED ORDER — VITAMIN B-1 100 MG PO TABS
100.0000 mg | ORAL_TABLET | Freq: Every day | ORAL | Status: DC
  Administered 2019-06-01: 100 mg via ORAL
  Filled 2019-06-01: qty 1

## 2019-06-01 MED ORDER — LORAZEPAM 1 MG PO TABS
1.0000 mg | ORAL_TABLET | Freq: Four times a day (QID) | ORAL | Status: DC | PRN
  Administered 2019-06-01: 1 mg via ORAL
  Filled 2019-06-01: qty 1

## 2019-06-01 NOTE — H&P (Signed)
History and Physical    Pedro Mitchell Doland QIO:[id number] DOB: 12/24/1960 DOA: 06/01/2019  PCP: Effie Shyoleman [facility] Consultants:  None Patient coming from:  Home - lives with mother; Jackey LogeOK: Mother, [phone]  Chief Complaint: elbow pain  HPI: Pedro Mitchell Mccreadie is a 58 y.o. male with medical history significant of ETOH dependence presenting with elbow pain after a fall.  He was seen in the ER on 8/14 after falling while getting off the bus.  He was found to have a displaced left olecranon process fracture; he was splinted and there was plan for outpatient f/u and probable need for surgical correction. He could not afford to f/u at ortho clinic and so returned to the ER today.  He has been off and on fine.  He has not had significant left elbow pain but is unable to use the arm.  No fevers.  No current SOB.  Drinks 3-4 beers a day, last use 3 days ago.  Wants to quit.  He denies withdrawal symptoms - not anxious, nervous, jittery.  No seizures.   ED Course:   Seen 8/14 for a fall, complicated elbow fracture.  He has significant social issues - no insurance, ETOH dependence.  He couldn't afford to f/u with orthopedics and so came to the ER.  Hgb decreased 2 grams - ? Bleeding.  Also with hypokalemia.  Tremulous, ?ETOH withdrawal.  To OR tomorrow AM, would like medical clearance overnight and treatment of withdrawal for now.  Review of Systems: As per HPI; otherwise review of systems reviewed and negative.   Ambulatory Status:  Ambulates without assistance  Past Medical History:  Diagnosis Date  . Alcohol dependence (HCC)     Past Surgical History:  Procedure Laterality Date  . ANKLE SURGERY      Social History   Socioeconomic History  . Marital status: Single    Spouse name: Not on file  . Number of children: Not on file  . Years of education: Not on file  . Highest education level: Not on file  Occupational History  . Occupation: welder  Social Needs  .  Financial resource strain: Not on file  . Food insecurity    Worry: Not on file    Inability: Not on file  . Transportation needs    Medical: Not on file    Non-medical: Not on file  Tobacco Use  . Smoking status: Never Smoker  . Smokeless tobacco: Former Engineer, waterUser  Substance and Sexual Activity  . Alcohol use: Yes    Comment: h/o dependence, has been through treatment but continues to drink  . Drug use: No  . Sexual activity: Not Currently  Lifestyle  . Physical activity    Days per week: Not on file    Minutes per session: Not on file  . Stress: Not on file  Relationships  . Social Musicianconnections    Talks on phone: Not on file    Gets together: Not on file    Attends religious service: Not on file    Active member of club or organization: Not on file    Attends meetings of clubs or organizations: Not on file    Relationship status: Not on file  . Intimate partner violence    Fear of current or ex partner: Not on file    Emotionally abused: Not on file    Physically abused: Not on file    Forced sexual activity: Not on file  Other Topics Concern  .  Not on file  Social History Narrative  . Not on file    No Known Allergies  History reviewed. No pertinent family history.  Prior to Admission medications   Medication Sig Start Date End Date Taking? Authorizing Provider  acetaminophen (TYLENOL) 325 MG tablet Take 650 mg by mouth every 6 (six) hours as needed for mild pain.    [provider]  cephALEXin (KEFLEX) 500 MG capsule Take 1 capsule (500 mg total) by mouth 4 (four) times daily. 05/19/19   Dartha Lodge, PA-C  HYDROcodone-acetaminophen (NORCO) 5-325 MG tablet Take 1 tablet by mouth every 6 (six) hours as needed. 05/19/19   Dartha Lodge, PA-C  ibuprofen (ADVIL) 600 MG tablet Take 1 tablet (600 mg total) by mouth every 6 (six) hours as needed. 05/19/19   Dartha Lodge, PA-C  methocarbamol (ROBAXIN) 500 MG tablet Take 1 tablet (500 mg total) by mouth 2 (two) times  daily. 04/02/14   Santiago Glad, PA-C  naproxen (NAPROSYN) 500 MG tablet Take 1 tablet (500 mg total) by mouth 2 (two) times daily. 04/02/14   Santiago Glad, PA-C  oxyCODONE-acetaminophen (PERCOCET/ROXICET) 5-325 MG per tablet Take 1-2 tablets by mouth every 6 (six) hours as needed for severe pain. 04/02/14   Santiago Glad, PA-C    Physical Exam: Vitals:   06/01/19 1145 06/01/19 1327 06/01/19 1400 06/01/19 1441  BP: (!) 154/87 (!) 146/97 (!) 156/84 133/84  Pulse: 62 65  72  Resp: 11   17  Temp:    98.2 F (36.8 C)  TempSrc:    Oral  SpO2: 100% 99%  99%  Weight:    81.6 kg  Height:    6\' 5"  (1.956 m)     . General:  Appears calm and comfortable and is NAD . Eyes:  PERRL, EOMI, normal lids, iris . ENT:  grossly normal hearing, lips & tongue, mmm . Neck:  no LAD, masses or thyromegaly . Cardiovascular:  RRR, no m/r/g. No LE edema.  Marland Kitchen Respiratory:   CTA bilaterally with no wheezes/rales/rhonchi.  Normal respiratory effort. . Abdomen:  soft, NT, ND, NABS . Skin:  Marked and diffuse LUE ecchymoses       . Musculoskeletal:  Left elbow effusion with limited elbow ROM but otherwise reasonable ROM of the arm     . Lower extremity:  No LE edema.  Limited foot exam with no ulcerations.  2+ distal pulses. Marland Kitchen Psychiatric:  Very flat mood and affect, speech fluent and appropriate, AOx3 . Neurologic:  CN 2-12 grossly intact, moves all extremities in coordinated fashion    Radiological Exams on Admission: Dg Elbow Complete Left  Result Date: 06/01/2019 CLINICAL DATA:  58 year old male status post fall 2 weeks ago with comminuted fracture of the olecranon. EXAM: LEFT ELBOW - COMPLETE 3+ VIEW COMPARISON:  Left elbow CT and left elbow radiographs 05/19/2019. FINDINGS: Comminuted fracture through the trochlear notch and olecranon redemonstrated. There is about 50% less anterior to posterior displacement of the butterfly fragment which includes the olecranon process. However,  angulation of the butterfly fragment appears mildly increased (lateral view). Continued impaction of the trochlea on the fracture site. Comminution of the olecranon was more apparent by CT. Radiocapitellar alignment and joint space is preserved. The distal humerus appears to remain intact. Superimposed chronic medial epicondyle ossific fragments. Generalized soft tissue swelling at the elbow. IMPRESSION: 1. Comminuted intra-articular fracture through the proximal ulna, trochlear notch and olecranon. About 50% less anterior to posterior displacement but mildly increased angulation  of the butterfly fragment which includes the olecranon process. 2. Continued impaction of the trochlea on the fracture site. 3. Distal humerus and proximal radius remain intact and appear to be normally aligned. Electronically Signed   By: Genevie Ann M.D.   On: 06/01/2019 10:04   Dg Chest Port 1 View  Result Date: 06/01/2019 CLINICAL DATA:  Left elbow fracture.  Preoperative exam. EXAM: PORTABLE CHEST 1 VIEW COMPARISON:  None. FINDINGS: The heart size and mediastinal contours are within normal limits. Both lungs are clear. The visualized skeletal structures are unremarkable. IMPRESSION: No acute cardiopulmonary findings. Electronically Signed   By: Davina Poke M.D.   On: 06/01/2019 11:58    EKG: Independently reviewed.  NSR with rate 66; no evidence of acute ischemia   Labs on Admission: I have personally reviewed the available labs and imaging studies at the time of the admission.  Pertinent labs:   K+ 2.9 Albumin 3.4 AST 44/ALT 36 WBC 11.9 Hgb 9.9 INR 1.1 COVID negative UA unremarkable  Assessment/Plan Principal Problem:   Elbow fracture, left, sequela Active Problems:   Alcohol dependence with uncomplicated withdrawal (HCC)   Hypokalemia   L elbow fracture -Patient with mechanical fall off a public transport vehicle on 8/75 -Complicated fracture, needs operative repair -Due to lack of insurance and  transportation, patient has been unable to f/u with orthopedics -Will observe overnight with plan for operative repair tomorrow by Dr. Marcelino Scot -He may be appropriate for d/c to home tomorrow post-operatively  ETOH dependence -Patient with chronic ETOH dependence -Has failed rehab multiple times -Denies drinking in the last few days, but had CIWA score of 6 on presentation and so likely experiencing mild withdrawal - but he is at high risk for complications of withdrawal including seizures, DTs -Will observe -CIWA protocol  Hypokalemia -Repleted in ER and with IVF with 20 mEq KCl/L   -Will follow.   -Will check Mag level.  Note: This patient has been tested and is negative for the novel coronavirus COVID-19.  DVT prophylaxis:  SCDs Code Status:  Full - confirmed with patient Family Communication: None present  Disposition Plan:  Home once clinically improved Consults called: Orthopedics  Admission status: It is my clinical opinion that referral for OBSERVATION is reasonable and necessary in this patient based on the above information provided. The aforementioned taken together are felt to place the patient at high risk for further clinical deterioration. However it is anticipated that the patient may be medically stable for discharge from the hospital within 24 to 48 hours.    Karmen Bongo MD [facility]   How to contact the [facility] Attending or Consulting provider [place] or covering provider during after hours [place], for this patient?  1. Check the care team in [facility] and look for a) attending/consulting [facility] provider listed and b) the [facility] team listed 2. Log into [email or web address] and use [place]'s universal password to access. If you do not have the password, please contact the hospital operator. 3. Locate the [facility] provider you are looking for under [facility] and page to a number that you can be directly reached. 4. If you still have difficulty reaching the provider,  please page the [facility] (Director on Call) for the Hospitalists listed on amion for assistance.   06/01/2019, 6:09 PM

## 2019-06-01 NOTE — ED Notes (Signed)
Lunch tray ordered 

## 2019-06-01 NOTE — ED Notes (Addendum)
ED TO INPATIENT HANDOFF REPORT  ED Nurse Name and Phone #:  Dorathy DaftKayla [phone]  S Name/Age/Gender Pedro Mitchell 58 y.o. male Room/Bed: 005C/005C  Code Status   Code Status: Full Code  Home/SNF/Other Home Patient oriented to: self, place, time and situation Is this baseline? Yes   Triage Complete: Triage complete  Chief Complaint arm pain  Triage Note Pt requesting to get his left elbow rechecked. Pt reports getting referred to an ortho doc but he can't afford it so he is here today to get it rechecked. Pt has bruising that concerns him.    Allergies No Known Allergies  Level of Care/Admitting Diagnosis ED Disposition    ED Disposition Condition Comment   Admit  [name] [[id number]]  Level of Care: Telemetry Medical [104]  I expect the patient will be discharged within 24 hours: No (not a candidate for 5C-Observation unit)  Covid Evaluation: Asymptomatic Screening Protocol (No Symptoms)  Diagnosis: Elbow fracture, left, sequela [454098][[id number]]  Admitting Physician: Jonah BlueYATES, JENNIFER [[id number]]  Attending Physician: Jonah BlueYATES, JENNIFER [[id number]]  PT Class (Do Not Modify): Observation [104]  PT Acc Code (Do Not Modify): Observation [10022]       B Medical/Surgery History Past Medical History:  Diagnosis Date  . Alcohol dependence (HCC)    Past Surgical History:  Procedure Laterality Date  . ANKLE SURGERY       A IV Location/Drains/Wounds Patient Lines/Drains/Airways Status   Active Line/Drains/Airways    None          Intake/Output Last 24 hours No intake or output data in the 24 hours ending 06/01/19 1336  Labs/Imaging Results for orders placed or performed during the hospital encounter of 06/01/19 (from the past 48 hour(s))  Comprehensive metabolic panel     Status: Abnormal   Collection Time: 06/01/19  9:18 AM  Result Value Ref Range   Sodium 137 135 - 145 mmol/L   Potassium 2.9 (L) 3.5 - 5.1 mmol/L   Chloride 100 98 - 111 mmol/L    CO2 24 22 - 32 mmol/L   Glucose, Bld 96 70 - 99 mg/dL   BUN 6 6 - 20 mg/dL   Creatinine, Ser 1.190.78 0.61 - 1.24 mg/dL   Calcium 8.9 8.9 - 14.710.3 mg/dL   Total Protein 6.7 6.5 - 8.1 g/dL   Albumin 3.4 (L) 3.5 - 5.0 g/dL   AST 44 (H) 15 - 41 U/L   ALT 36 0 - 44 U/L   Alkaline Phosphatase 79 38 - 126 U/L   Total Bilirubin 1.1 0.3 - 1.2 mg/dL   GFR calc non Af Amer >60 >60 mL/min   GFR calc Af Amer >60 >60 mL/min   Anion gap 13 5 - 15    Comment: Performed at [facility], [address] [place], [place] [address]  Protime-INR     Status: None   Collection Time: 06/01/19  9:18 AM  Result Value Ref Range   Prothrombin Time 14.2 11.4 - 15.2 seconds   INR 1.1 0.8 - 1.2    Comment: (NOTE) INR goal varies based on device and disease states. Performed at [facility], [address] [place], [place] [address]   CBC with Differential/Platelet     Status: Abnormal   Collection Time: 06/01/19  9:18 AM  Result Value Ref Range   WBC 11.9 (H) 4.0 - 10.5 K/uL   RBC 3.17 (L) 4.22 - 5.81 MIL/uL   Hemoglobin 9.9 (L) 13.0 -  17.0 g/dL   HCT 85.9 (L) 09.3 - 11.2 %   MCV 101.3 (H) 80.0 - 100.0 fL   MCH 31.2 26.0 - 34.0 pg   MCHC 30.8 30.0 - 36.0 g/dL   RDW 16.2 44.6 - 95.0 %   Platelets 440 (H) 150 - 400 K/uL   nRBC 0.0 0.0 - 0.2 %   Neutrophils Relative % 57 %   Neutro Abs 6.8 1.7 - 7.7 K/uL   Lymphocytes Relative 15 %   Lymphs Abs 1.7 0.7 - 4.0 K/uL   Monocytes Relative 26 %   Monocytes Absolute 3.1 (H) 0.1 - 1.0 K/uL   Eosinophils Relative 0 %   Eosinophils Absolute 0.0 0.0 - 0.5 K/uL   Basophils Relative 0 %   Basophils Absolute 0.0 0.0 - 0.1 K/uL   Smear Review PLATELET CLUMPS NOTED ON SMEAR    Immature Granulocytes 2 %   Abs Immature Granulocytes 0.26 (H) 0.00 - 0.07 K/uL    Comment: Performed at [facility], [address] [place], [place] [address]  Urinalysis, Routine w reflex microscopic     Status: Abnormal   Collection Time: 06/01/19 11:56 AM  Result  Value Ref Range   Color, Urine STRAW (A) YELLOW   APPearance CLEAR CLEAR   Specific Gravity, Urine 1.003 (L) 1.005 - 1.030   pH 6.0 5.0 - 8.0   Glucose, UA NEGATIVE NEGATIVE mg/dL   Hgb urine dipstick NEGATIVE NEGATIVE   Bilirubin Urine NEGATIVE NEGATIVE   Ketones, ur NEGATIVE NEGATIVE mg/dL   Protein, ur NEGATIVE NEGATIVE mg/dL   Nitrite NEGATIVE NEGATIVE   Leukocytes,Ua NEGATIVE NEGATIVE    Comment: Performed at [facility], [address], [place], [place] [address]   Dg Elbow Complete Left  Result Date: 06/01/2019 CLINICAL DATA:  58 year old male status post fall 2 weeks ago with comminuted fracture of the olecranon. EXAM: LEFT ELBOW - COMPLETE 3+ VIEW COMPARISON:  Left elbow CT and left elbow radiographs 05/19/2019. FINDINGS: Comminuted fracture through the trochlear notch and olecranon redemonstrated. There is about 50% less anterior to posterior displacement of the butterfly fragment which includes the olecranon process. However, angulation of the butterfly fragment appears mildly increased (lateral view). Continued impaction of the trochlea on the fracture site. Comminution of the olecranon was more apparent by CT. Radiocapitellar alignment and joint space is preserved. The distal humerus appears to remain intact. Superimposed chronic medial epicondyle ossific fragments. Generalized soft tissue swelling at the elbow. IMPRESSION: 1. Comminuted intra-articular fracture through the proximal ulna, trochlear notch and olecranon. About 50% less anterior to posterior displacement but mildly increased angulation of the butterfly fragment which includes the olecranon process. 2. Continued impaction of the trochlea on the fracture site. 3. Distal humerus and proximal radius remain intact and appear to be normally aligned. Electronically Signed   By: Odessa Fleming M.D.   On: 06/01/2019 10:04   Dg Chest Port 1 View  Result Date: 06/01/2019 CLINICAL DATA:  Left elbow fracture.  Preoperative exam.  EXAM: PORTABLE CHEST 1 VIEW COMPARISON:  None. FINDINGS: The heart size and mediastinal contours are within normal limits. Both lungs are clear. The visualized skeletal structures are unremarkable. IMPRESSION: No acute cardiopulmonary findings. Electronically Signed   By: Duanne Guess M.D.   On: 06/01/2019 11:58    Pending Labs Unresulted Labs (From admission, onward)    Start     Ordered   06/02/19 0500  Basic metabolic panel  Tomorrow morning,   R  06/01/19 1205   06/02/19 0500  CBC  Tomorrow morning,   R     06/01/19 1205   06/01/19 1203  HIV antibody (Routine Testing)  Once,   STAT     06/01/19 1205   06/01/19 1118  SARS CORONAVIRUS 2 (TAT 6-12 HRS) Nasal Swab Aptima Multi Swab  (Asymptomatic/Tier 2 Patients Labs)  Once,   STAT    Question Answer Comment  Is this test for diagnosis or screening Screening   Symptomatic for COVID-19 as defined by CDC No   Hospitalized for COVID-19 No   Admitted to ICU for COVID-19 No   Previously tested for COVID-19 No   Resident in a congregate (group) care setting No   Employed in healthcare setting No      06/01/19 1117          Vitals/Pain Today's Vitals   06/01/19 0835 06/01/19 0836 06/01/19 1145 06/01/19 1330  BP: (!) 135/92  (!) 154/87   Pulse: 72  62   Resp: 15  11   Temp: 97.7 F (36.5 C)     TempSrc: Oral     SpO2: 99%  100%   Weight: 83.9 kg     Height: 6\' 5"  (1.956 m)     PainSc:  5   2     Isolation Precautions No active isolations  Medications Medications  LORazepam (ATIVAN) injection 0-4 mg ( Intravenous See Alternative 06/01/19 1208)    Or  LORazepam (ATIVAN) tablet 0-4 mg (0 mg Oral Not Given 06/01/19 1208)  HYDROcodone-acetaminophen (NORCO/VICODIN) 5-325 MG per tablet 1 tablet (has no administration in time range)  naproxen (NAPROSYN) tablet 500 mg (has no administration in time range)  acetaminophen (TYLENOL) tablet 650 mg (has no administration in time range)    Or  acetaminophen (TYLENOL) suppository  650 mg (has no administration in time range)  docusate sodium (COLACE) capsule 100 mg (100 mg Oral Given 06/01/19 1327)  ondansetron (ZOFRAN) tablet 4 mg (has no administration in time range)    Or  ondansetron (ZOFRAN) injection 4 mg (has no administration in time range)  LORazepam (ATIVAN) tablet 1 mg (has no administration in time range)    Or  LORazepam (ATIVAN) injection 1 mg (has no administration in time range)  thiamine (VITAMIN B-1) tablet 100 mg (100 mg Oral Not Given 06/01/19 1208)    Or  thiamine (B-1) injection 100 mg ( Intravenous See Alternative 1/61/09 6045)  folic acid (FOLVITE) tablet 1 mg (1 mg Oral Given 06/01/19 1328)  multivitamin with minerals tablet 1 tablet (1 tablet Oral Given 06/01/19 1327)  sodium chloride flush (NS) 0.9 % injection 3 mL (3 mLs Intravenous Not Given 06/01/19 1209)  0.9 % NaCl with KCl 20 mEq/ L  infusion (has no administration in time range)  potassium chloride SA (K-DUR) CR tablet 40 mEq (40 mEq Oral Given 06/01/19 1155)    Mobility walks Low fall risk   Focused Assessments    R Recommendations: See Admitting Provider Note  Report given to: Sharyn Lull RN  Additional Notes:
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
## 2019-06-01 NOTE — ED Triage Notes (Signed)
Pt requesting to get his left elbow rechecked. Pt reports getting referred to an ortho doc but he can't afford it so he is here today to get it rechecked. Pt has bruising that concerns him.
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
## 2019-06-01 NOTE — ED Notes (Signed)
Attempted report, will try again in a few.

## 2019-06-01 NOTE — ED Notes (Signed)
Patient transported to X-ray 

## 2019-06-01 NOTE — ED Notes (Signed)
Pt aware a urine sample is needed and will provide one when able.  

## 2019-06-01 NOTE — ED Notes (Addendum)
Pt is aware that a urine specimen is needed.

## 2019-06-02 ENCOUNTER — Observation Stay (HOSPITAL_COMMUNITY): Payer: Self-pay

## 2019-06-02 ENCOUNTER — Encounter (HOSPITAL_COMMUNITY)
Admission: EM | Disposition: A | Discharge status: Home / Self Care | Payer: Self-pay | Source: Home / Self Care | Attending: Internal Medicine

## 2019-06-02 ENCOUNTER — Observation Stay (HOSPITAL_COMMUNITY): Payer: Self-pay | Admitting: Anesthesiology

## 2019-06-02 ENCOUNTER — Encounter (HOSPITAL_COMMUNITY): Payer: Self-pay | Admitting: Certified Registered"

## 2019-06-02 DIAGNOSIS — Z596 Low income: Secondary | ICD-10-CM | POA: Diagnosis not present

## 2019-06-02 DIAGNOSIS — Z20828 Contact with and (suspected) exposure to other viral communicable diseases: Secondary | ICD-10-CM | POA: Diagnosis present

## 2019-06-02 DIAGNOSIS — F10230 Alcohol dependence with withdrawal, uncomplicated: Secondary | ICD-10-CM | POA: Diagnosis present

## 2019-06-02 DIAGNOSIS — E876 Hypokalemia: Secondary | ICD-10-CM | POA: Diagnosis present

## 2019-06-02 DIAGNOSIS — Z608 Other problems related to social environment: Secondary | ICD-10-CM | POA: Diagnosis present

## 2019-06-02 DIAGNOSIS — S42409A Unspecified fracture of lower end of unspecified humerus, initial encounter for closed fracture: Secondary | ICD-10-CM | POA: Diagnosis present

## 2019-06-02 DIAGNOSIS — S52032A Displaced fracture of olecranon process with intraarticular extension of left ulna, initial encounter for closed fracture: Secondary | ICD-10-CM | POA: Diagnosis present

## 2019-06-02 DIAGNOSIS — D649 Anemia, unspecified: Secondary | ICD-10-CM | POA: Diagnosis present

## 2019-06-02 DIAGNOSIS — Z87891 Personal history of nicotine dependence: Secondary | ICD-10-CM | POA: Diagnosis not present

## 2019-06-02 HISTORY — PX: ORIF ELBOW FRACTURE: SHX5031

## 2019-06-02 LAB — BASIC METABOLIC PANEL
Anion gap: 8 (ref 5–15)
BUN: <5 mg/dL — ABNORMAL LOW (ref 6–20)
CO2: 25 mmol/L (ref 22–32)
Calcium: 8.5 mg/dL — ABNORMAL LOW (ref 8.9–10.3)
Chloride: 108 mmol/L (ref 98–111)
Creatinine, Ser: 0.68 mg/dL (ref 0.61–1.24)
GFR calc Af Amer: >60 mL/min (ref >60)
GFR calc non Af Amer: >60 mL/min (ref >60)
Glucose, Bld: 110 mg/dL — ABNORMAL HIGH (ref 70–99)
Potassium: 3.6 mmol/L (ref 3.5–5.1)
Sodium: 141 mmol/L (ref 135–145)

## 2019-06-02 LAB — RAPID URINE DRUG SCREEN, HOSP PERFORMED
Amphetamines: NOT DETECTED
Barbiturates: NOT DETECTED
Benzodiazepines: NOT DETECTED
Cocaine: NOT DETECTED
Opiates: NOT DETECTED
Tetrahydrocannabinol: NOT DETECTED

## 2019-06-02 LAB — CBC
HCT: 31.8 % — ABNORMAL LOW (ref 39.0–52.0)
Hemoglobin: 9.8 g/dL — ABNORMAL LOW (ref 13.0–17.0)
MCH: 30.6 pg (ref 26.0–34.0)
MCHC: 30.8 g/dL (ref 30.0–36.0)
MCV: 99.4 fL (ref 80.0–100.0)
Platelets: 428 10*3/uL — ABNORMAL HIGH (ref 150–400)
RBC: 3.2 MIL/uL — ABNORMAL LOW (ref 4.22–5.81)
RDW: 13.8 % (ref 11.5–15.5)
WBC: 10.9 10*3/uL — ABNORMAL HIGH (ref 4.0–10.5)
nRBC: 0 % (ref 0.0–0.2)

## 2019-06-02 LAB — HIV ANTIBODY (ROUTINE TESTING W REFLEX): HIV Screen 4th Generation wRfx: NONREACTIVE

## 2019-06-02 LAB — PATHOLOGIST SMEAR REVIEW

## 2019-06-02 SURGERY — OPEN REDUCTION INTERNAL FIXATION (ORIF) ELBOW/OLECRANON FRACTURE
Anesthesia: General | Site: Elbow | Laterality: Left

## 2019-06-02 MED ORDER — ONDANSETRON HCL 4 MG/2ML IJ SOLN
INTRAMUSCULAR | Status: AC
  Filled 2019-06-02: qty 2

## 2019-06-02 MED ORDER — MIDAZOLAM HCL 2 MG/2ML IJ SOLN
INTRAMUSCULAR | Status: AC
  Filled 2019-06-02: qty 2

## 2019-06-02 MED ORDER — MENTHOL 3 MG MT LOZG: 1.0000 | LOZENGE | OROMUCOSAL | Status: DC | PRN

## 2019-06-02 MED ORDER — PROPOFOL 10 MG/ML IV BOLUS
INTRAVENOUS | Status: AC
  Filled 2019-06-02: qty 20

## 2019-06-02 MED ORDER — ONDANSETRON HCL 4 MG/2ML IJ SOLN: 4.0000 mg | Freq: Four times a day (QID) | INTRAMUSCULAR | Status: DC | PRN

## 2019-06-02 MED ORDER — TRANEXAMIC ACID-NACL 1000-0.7 MG/100ML-% IV SOLN
INTRAVENOUS | Status: AC
  Filled 2019-06-02: qty 100

## 2019-06-02 MED ORDER — MIDAZOLAM HCL 2 MG/2ML IJ SOLN
INTRAMUSCULAR | Status: AC
  Administered 2019-06-02: 1 mg via INTRAVENOUS
  Filled 2019-06-02: qty 2

## 2019-06-02 MED ORDER — DEXAMETHASONE SODIUM PHOSPHATE 10 MG/ML IJ SOLN
INTRAMUSCULAR | Status: AC
  Filled 2019-06-02: qty 1

## 2019-06-02 MED ORDER — TRANEXAMIC ACID-NACL 1000-0.7 MG/100ML-% IV SOLN
INTRAVENOUS | Status: DC | PRN
  Administered 2019-06-02: 1000 mg via INTRAVENOUS

## 2019-06-02 MED ORDER — FENTANYL CITRATE (PF) 100 MCG/2ML IJ SOLN
INTRAMUSCULAR | Status: AC
  Administered 2019-06-02: 50 ug via INTRAVENOUS
  Filled 2019-06-02: qty 2

## 2019-06-02 MED ORDER — PROPOFOL 1000 MG/100ML IV EMUL
INTRAVENOUS | Status: AC
  Filled 2019-06-02: qty 300

## 2019-06-02 MED ORDER — METOCLOPRAMIDE HCL 5 MG/ML IJ SOLN: 5.0000 mg | Freq: Three times a day (TID) | INTRAMUSCULAR | Status: DC | PRN

## 2019-06-02 MED ORDER — MIDAZOLAM HCL 2 MG/2ML IJ SOLN
1.0000 mg | Freq: Once | INTRAMUSCULAR | Status: AC
  Administered 2019-06-02: 07:00:00 1 mg via INTRAVENOUS

## 2019-06-02 MED ORDER — ONDANSETRON HCL 4 MG PO TABS: 4.0000 mg | ORAL_TABLET | Freq: Four times a day (QID) | ORAL | Status: DC | PRN

## 2019-06-02 MED ORDER — FENTANYL CITRATE (PF) 100 MCG/2ML IJ SOLN
50.0000 ug | Freq: Once | INTRAMUSCULAR | Status: AC
  Administered 2019-06-02: 07:00:00 50 ug via INTRAVENOUS

## 2019-06-02 MED ORDER — ROCURONIUM BROMIDE 10 MG/ML (PF) SYRINGE
PREFILLED_SYRINGE | INTRAVENOUS | Status: AC
  Filled 2019-06-02: qty 10

## 2019-06-02 MED ORDER — PROPOFOL 500 MG/50ML IV EMUL
INTRAVENOUS | Status: DC | PRN
  Administered 2019-06-02: 75 ug/kg/min via INTRAVENOUS

## 2019-06-02 MED ORDER — HYDROCODONE-ACETAMINOPHEN 7.5-325 MG PO TABS: 1.0000 | ORAL_TABLET | ORAL | Status: DC | PRN

## 2019-06-02 MED ORDER — BUPIVACAINE-EPINEPHRINE (PF) 0.5% -1:200000 IJ SOLN
INTRAMUSCULAR | Status: DC | PRN
  Administered 2019-06-02: 30 mL via PERINEURAL

## 2019-06-02 MED ORDER — CEFAZOLIN SODIUM-DEXTROSE 2-4 GM/100ML-% IV SOLN
2.0000 g | Freq: Four times a day (QID) | INTRAVENOUS | Status: AC
  Administered 2019-06-02 – 2019-06-03 (×3): 2 g via INTRAVENOUS
  Filled 2019-06-02 (×3): qty 100

## 2019-06-02 MED ORDER — DOCUSATE SODIUM 100 MG PO CAPS: 100.0000 mg | ORAL_CAPSULE | Freq: Two times a day (BID) | ORAL | Status: DC

## 2019-06-02 MED ORDER — PHENOL 1.4 % MT LIQD: 1.0000 | OROMUCOSAL | Status: DC | PRN

## 2019-06-02 MED ORDER — 0.9 % SODIUM CHLORIDE (POUR BTL) OPTIME
TOPICAL | Status: DC | PRN
  Administered 2019-06-02: 10:00:00 1000 mL

## 2019-06-02 MED ORDER — HYDROCODONE-ACETAMINOPHEN 5-325 MG PO TABS
1.0000 | ORAL_TABLET | ORAL | Status: DC | PRN
  Administered 2019-06-02 – 2019-06-03 (×2): 2 via ORAL
  Filled 2019-06-02 (×2): qty 2

## 2019-06-02 MED ORDER — KETOROLAC TROMETHAMINE 15 MG/ML IJ SOLN
15.0000 mg | Freq: Four times a day (QID) | INTRAMUSCULAR | Status: AC
  Administered 2019-06-02 – 2019-06-03 (×4): 15 mg via INTRAVENOUS
  Filled 2019-06-02 (×4): qty 1

## 2019-06-02 MED ORDER — MORPHINE SULFATE (PF) 2 MG/ML IV SOLN
0.5000 mg | INTRAVENOUS | Status: DC | PRN
  Administered 2019-06-02 – 2019-06-03 (×2): 1 mg via INTRAVENOUS
  Filled 2019-06-02 (×2): qty 1

## 2019-06-02 MED ORDER — LACTATED RINGERS IV SOLN
INTRAVENOUS | Status: DC | PRN
  Administered 2019-06-02: 08:00:00 via INTRAVENOUS

## 2019-06-02 MED ORDER — ACETAMINOPHEN 325 MG PO TABS: 325.0000 mg | ORAL_TABLET | Freq: Four times a day (QID) | ORAL | Status: DC | PRN

## 2019-06-02 MED ORDER — LIDOCAINE 2% (20 MG/ML) 5 ML SYRINGE
INTRAMUSCULAR | Status: DC | PRN
  Administered 2019-06-02: 40 mg via INTRAVENOUS

## 2019-06-02 MED ORDER — FENTANYL CITRATE (PF) 250 MCG/5ML IJ SOLN
INTRAMUSCULAR | Status: AC
  Filled 2019-06-02: qty 5

## 2019-06-02 MED ORDER — MIDAZOLAM HCL 5 MG/5ML IJ SOLN
INTRAMUSCULAR | Status: DC | PRN
  Administered 2019-06-02 (×2): 1 mg via INTRAVENOUS

## 2019-06-02 MED ORDER — LACTATED RINGERS IV SOLN
INTRAVENOUS | Status: DC
  Administered 2019-06-02: 07:00:00 via INTRAVENOUS

## 2019-06-02 MED ORDER — METOCLOPRAMIDE HCL 5 MG PO TABS: 5.0000 mg | ORAL_TABLET | Freq: Three times a day (TID) | ORAL | Status: DC | PRN

## 2019-06-02 SURGICAL SUPPLY — 103 items
APL SKNCLS STERI-STRIP NONHPOA (GAUZE/BANDAGES/DRESSINGS)
BENZOIN TINCTURE PRP APPL 2/3 (GAUZE/BANDAGES/DRESSINGS) IMPLANT
BIT DRILL 2.0 LNG QUCK RELEASE (BIT) IMPLANT
BIT DRILL 2.8 QUICK RELEASE (BIT) IMPLANT
BIT DRILL QUICK RELEASE 3.5MM (BIT) IMPLANT
BLADE AVERAGE 25MMX9MM (BLADE) ×1
BLADE AVERAGE 25X9 (BLADE) ×2 IMPLANT
BLADE CLIPPER SURG (BLADE) ×3 IMPLANT
BLADE SURG 10 STRL SS (BLADE) IMPLANT
BNDG CMPR 9X4 STRL LF SNTH (GAUZE/BANDAGES/DRESSINGS) ×1
BNDG COHESIVE 4X5 TAN STRL (GAUZE/BANDAGES/DRESSINGS) ×3 IMPLANT
BNDG ELASTIC 3X5.8 VLCR STR LF (GAUZE/BANDAGES/DRESSINGS) ×3 IMPLANT
BNDG ELASTIC 4X5.8 VLCR STR LF (GAUZE/BANDAGES/DRESSINGS) ×3 IMPLANT
BNDG ELASTIC 6X5.8 VLCR STR LF (GAUZE/BANDAGES/DRESSINGS) ×3 IMPLANT
BNDG ESMARK 4X9 LF (GAUZE/BANDAGES/DRESSINGS) ×3 IMPLANT
BNDG GAUZE ELAST 4 BULKY (GAUZE/BANDAGES/DRESSINGS) ×3 IMPLANT
BRUSH SCRUB EZ PLAIN DRY (MISCELLANEOUS) ×6 IMPLANT
CLEANER TIP ELECTROSURG 2X2 (MISCELLANEOUS) ×3 IMPLANT
CLOSURE WOUND 1/2 X4 (GAUZE/BANDAGES/DRESSINGS)
COVER SURGICAL LIGHT HANDLE (MISCELLANEOUS) ×6 IMPLANT
COVER WAND RF STERILE (DRAPES) ×3 IMPLANT
CUFF TOURN SGL QUICK 18X4 (TOURNIQUET CUFF) IMPLANT
CUFF TOURN SGL QUICK 24 (TOURNIQUET CUFF)
CUFF TRNQT CYL 24X4X16.5-23 (TOURNIQUET CUFF) IMPLANT
DECANTER SPIKE VIAL GLASS SM (MISCELLANEOUS) IMPLANT
DRAPE C-ARM 42X72 X-RAY (DRAPES) IMPLANT
DRAPE C-ARMOR (DRAPES) ×3 IMPLANT
DRAPE INCISE IOBAN 66X45 STRL (DRAPES) IMPLANT
DRAPE U-SHAPE 47X51 STRL (DRAPES) ×3 IMPLANT
DRILL 2.0 LNG QUICK RELEASE (BIT) ×3
DRILL 2.8 QUICK RELEASE (BIT) ×3
DRILL QUICK RELEASE 3.5MM (BIT) ×3
DRSG ADAPTIC 3X8 NADH LF (GAUZE/BANDAGES/DRESSINGS) IMPLANT
DRSG EMULSION OIL 3X3 NADH (GAUZE/BANDAGES/DRESSINGS) IMPLANT
DRSG MEPITEL 4X7.2 (GAUZE/BANDAGES/DRESSINGS) ×2 IMPLANT
ELECT REM PT RETURN 9FT ADLT (ELECTROSURGICAL) ×3
ELECTRODE REM PT RTRN 9FT ADLT (ELECTROSURGICAL) ×1 IMPLANT
FACESHIELD WRAPAROUND (MASK) IMPLANT
FACESHIELD WRAPAROUND OR TEAM (MASK) IMPLANT
GAUZE SPONGE 4X4 12PLY STRL (GAUZE/BANDAGES/DRESSINGS) ×3 IMPLANT
GAUZE XEROFORM 1X8 LF (GAUZE/BANDAGES/DRESSINGS) ×3 IMPLANT
GAUZE XEROFORM 5X9 LF (GAUZE/BANDAGES/DRESSINGS) ×3 IMPLANT
GLOVE BIO SURGEON STRL SZ7.5 (GLOVE) ×3 IMPLANT
GLOVE BIO SURGEON STRL SZ8 (GLOVE) ×3 IMPLANT
GLOVE BIOGEL PI IND STRL 7.5 (GLOVE) ×1 IMPLANT
GLOVE BIOGEL PI IND STRL 8 (GLOVE) ×1 IMPLANT
GLOVE BIOGEL PI INDICATOR 7.5 (GLOVE) ×2
GLOVE BIOGEL PI INDICATOR 8 (GLOVE) ×2
GOWN STRL REUS W/ TWL LRG LVL3 (GOWN DISPOSABLE) ×2 IMPLANT
GOWN STRL REUS W/ TWL XL LVL3 (GOWN DISPOSABLE) ×1 IMPLANT
GOWN STRL REUS W/TWL LRG LVL3 (GOWN DISPOSABLE) ×6
GOWN STRL REUS W/TWL XL LVL3 (GOWN DISPOSABLE) ×3
GUIDEWIRE ORTH 6X062XTROC NS (WIRE) IMPLANT
GUIDEWIRE ORTHO 2.0X9 ST (WIRE) ×2 IMPLANT
K-WIRE .062 (WIRE) ×6
KIT BASIN OR (CUSTOM PROCEDURE TRAY) ×3 IMPLANT
KIT TURNOVER KIT B (KITS) ×3 IMPLANT
MANIFOLD NEPTUNE II (INSTRUMENTS) ×3 IMPLANT
NS IRRIG 1000ML POUR BTL (IV SOLUTION) ×3 IMPLANT
PACK ORTHO EXTREMITY (CUSTOM PROCEDURE TRAY) ×3 IMPLANT
PAD ABD 7.5X8 STRL (GAUZE/BANDAGES/DRESSINGS) ×4 IMPLANT
PAD ARMBOARD 7.5X6 YLW CONV (MISCELLANEOUS) ×6 IMPLANT
PAD CAST 4YDX4 CTTN HI CHSV (CAST SUPPLIES) ×1 IMPLANT
PADDING CAST COTTON 4X4 STRL (CAST SUPPLIES) ×3
PADDING CAST COTTON 6X4 STRL (CAST SUPPLIES) ×2 IMPLANT
PLATE OLECRANON 5HOLE LEFT STD (Plate) ×2 IMPLANT
SCREW HEX LOCK 2.7X16MM (Screw) ×4 IMPLANT
SCREW HEX NON LOCK 3.5X55MM (Screw) ×2 IMPLANT
SCREW LOCK 22X2.7X HEXALOBE (Screw) IMPLANT
SCREW LOCK 24X2.7X HEXALOBE (Screw) IMPLANT
SCREW LOCK 40X3.5X HEXALOBE (Screw) IMPLANT
SCREW LOCK 55X3.5X HEXALOBE (Screw) IMPLANT
SCREW LOCKING 2.7X22MM (Screw) ×6 IMPLANT
SCREW LOCKING 2.7X24MM (Screw) ×3 IMPLANT
SCREW LOCKING 3.5X40 (Screw) ×3 IMPLANT
SCREW LOCKING 3.5X55 (Screw) ×3 IMPLANT
SCREW NLOCK 24X2.7 (Screw) IMPLANT
SCREW NON LOCKING HEX 2.7X26 (Screw) ×2 IMPLANT
SCREW NON LOCKING HEX 3.5X22 (Screw) ×2 IMPLANT
SCREW NONLOCK 2.7X24 (Screw) ×3 IMPLANT
SCREW NONLOCKING 3.5X28MM (Screw) ×2 IMPLANT
SPONGE LAP 18X18 RF (DISPOSABLE) ×6 IMPLANT
STAPLER VISISTAT 35W (STAPLE) ×3 IMPLANT
STRIP CLOSURE SKIN 1/2X4 (GAUZE/BANDAGES/DRESSINGS) IMPLANT
SUCTION FRAZIER HANDLE 10FR (MISCELLANEOUS) ×2
SUCTION TUBE FRAZIER 10FR DISP (MISCELLANEOUS) ×1 IMPLANT
SUT ETHILON 2 0 FS 18 (SUTURE) ×4 IMPLANT
SUT PDS 2 0 CTB 1 36 (SUTURE) ×4 IMPLANT
SUT PDS AB 2-0 CT1 27 (SUTURE) IMPLANT
SUT PROLENE 3 0 PS 2 (SUTURE) ×6 IMPLANT
SUT VIC AB 0 CT1 27 (SUTURE) ×6
SUT VIC AB 0 CT1 27XBRD ANBCTR (SUTURE) ×2 IMPLANT
SUT VIC AB 2-0 CT1 27 (SUTURE) ×6
SUT VIC AB 2-0 CT1 TAPERPNT 27 (SUTURE) ×2 IMPLANT
SUT VIC AB 2-0 CT3 27 (SUTURE) IMPLANT
SYR CONTROL 10ML LL (SYRINGE) ×3 IMPLANT
TOWEL GREEN STERILE (TOWEL DISPOSABLE) ×6 IMPLANT
TOWEL GREEN STERILE FF (TOWEL DISPOSABLE) IMPLANT
TUBE CONNECTING 12'X1/4 (SUCTIONS) ×1
TUBE CONNECTING 12X1/4 (SUCTIONS) ×2 IMPLANT
UNDERPAD 30X30 (UNDERPADS AND DIAPERS) ×3 IMPLANT
WATER STERILE IRR 1000ML POUR (IV SOLUTION) ×3 IMPLANT
YANKAUER SUCT BULB TIP NO VENT (SUCTIONS) ×3 IMPLANT

## 2019-06-02 NOTE — Progress Notes (Signed)
Came to see patient, already in surgery- will try again later Eulogio Bear
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
## 2019-06-02 NOTE — Brief Op Note (Signed)
06/02/2019  11:10 AM  PATIENT:  Pedro Mitchell  58 y.o. male  PRE-OPERATIVE DIAGNOSIS:  SUBACUTE OLECRANON FRACTURE  POST-OPERATIVE DIAGNOSIS:  SUBACUTE OLECRANON FRACTURE  PROCEDURE:  Procedure(s): OPEN REDUCTION INTERNAL FIXATION (ORIF) ELBOW/OLECRANON FRACTURE (Left)  SURGEON:  Surgeon(s) and Role:    Altamese Mesilla, MD - Primary  PHYSICIAN ASSISTANT: KEITH PAUL,PA-C  ANESTHESIA:   general  EBL:  100 mL   BLOOD ADMINISTERED:none  DRAINS: none   LOCAL MEDICATIONS USED:  NONE  SPECIMEN:  No Specimen  DISPOSITION OF SPECIMEN:  N/A  COUNTS:  YES  TOURNIQUET:  * No tourniquets in log *  DICTATION: .Note written in EPIC  PLAN OF CARE: Admit to inpatient   PATIENT DISPOSITION:  PACU - hemodynamically stable.   Delay start of Pharmacological VTE agent (>24hrs) due to surgical blood loss or risk of bleeding: no

## 2019-06-02 NOTE — Anesthesia Procedure Notes (Signed)
Anesthesia Regional Block: Supraclavicular block   Pre-Anesthetic Checklist: ,, timeout performed, Correct Patient, Correct Site, Correct Laterality, Correct Procedure, Correct Position, site marked, Risks and benefits discussed, pre-op evaluation,  At surgeon's request and post-op pain management  Laterality: Left  Prep: Maximum Sterile Barrier Precautions used, chloraprep       Needles:  Injection technique: Single-shot  Needle Type: Echogenic Stimulator Needle     Needle Length: 9cm  Needle Gauge: 22     Additional Needles:   Procedures:,,,, ultrasound used (permanent image in chart),,,,  Narrative:  Start time: 06/02/2019 7:22 AM End time: 06/02/2019 7:25 AM Injection made incrementally with aspirations every 5 mL.  Performed by: Personally  Anesthesiologist: Brennan Bailey, MD  Additional Notes: Risks, benefits, and alternative discussed. Patient gave consent for procedure. Patient prepped and draped in sterile fashion. Sedation administered, patient remains easily responsive to voice. Relevant anatomy identified with ultrasound guidance. Local anesthetic given in 5cc increments with no signs or symptoms of intravascular injection. No pain or paraesthesias with injection. Patient monitored throughout procedure with signs of LAST or immediate complications. Tolerated well. Ultrasound image placed in chart.  Tawny Asal, MD

## 2019-06-02 NOTE — Transfer of Care (Signed)
Immediate Anesthesia Transfer of Care Note  Patient: Pedro Mitchell  Procedure(s) Performed: OPEN REDUCTION INTERNAL FIXATION (ORIF) ELBOW/OLECRANON FRACTURE (Left Elbow)  Patient Location: PACU  Anesthesia Type:MAC combined with regional for post-op pain  Level of Consciousness: awake and patient cooperative  Airway & Oxygen Therapy: Patient Spontanous Breathing and Patient connected to face mask oxygen  Post-op Assessment: Report given to RN, Post -op Vital signs reviewed and stable and Patient moving all extremities X 4  Post vital signs: Reviewed and stable  Last Vitals:  Vitals Value Taken Time  BP 130/89 06/02/19 1033  Temp 36.1 C 06/02/19 1030  Pulse 72 06/02/19 1030  Resp 12 06/02/19 1033  SpO2    Vitals shown include unvalidated device data.  Last Pain:  Vitals:   06/02/19 0730  TempSrc:   PainSc: 5       Patients Stated Pain Goal: 1 (39/76/73 [id number])  Complications: No apparent anesthesia complications
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
## 2019-06-02 NOTE — Plan of Care (Signed)
  Problem: Coping: Goal: Level of anxiety will decrease Outcome: Progressing   Problem: Skin Integrity: Goal: Risk for impaired skin integrity will decrease Outcome: Progressing   

## 2019-06-02 NOTE — Op Note (Signed)
06/02/2019  11:10 AM  PATIENT:  Pedro Mitchell  58 y.o. male  PRE-OPERATIVE DIAGNOSIS:  SUBACUTE OLECRANON FRACTURE  POST-OPERATIVE DIAGNOSIS:  SUBACUTE OLECRANON FRACTURE  PROCEDURE:  Procedure(s): OPEN REDUCTION INTERNAL FIXATION (ORIF) ELBOW/OLECRANON FRACTURE (Left)  SURGEON:  Surgeon(s) and Role:    Altamese Andrew, MD - Primary  PHYSICIAN ASSISTANT: KEITH PAUL,PA-C  ANESTHESIA:   general  EBL:  100 mL   BLOOD ADMINISTERED:none  DRAINS: none   LOCAL MEDICATIONS USED:  NONE  SPECIMEN:  No Specimen  DISPOSITION OF SPECIMEN:  N/A  COUNTS:  YES  TOURNIQUET:  * No tourniquets in log *  DICTATION: .Note written in EPIC  PLAN OF CARE: Admit to inpatient   PATIENT DISPOSITION:  PACU - hemodynamically stable.   Delay start of Pharmacological VTE agent (>24hrs) due to surgical blood loss or risk of bleeding: no  INDICATIONS FOR PROCEDURE:  Pedro Mitchell is a pleasant 58 y.o. who has sustained a displaced olecranon fracture several weeks ago. Because of longstanding alcohol use and financial constraints he has not shown up as directed at the orthopaedic office and instead went to the ED and needed to be admitted for concerns about withdrawal as he has been attempting to quit drinking. I discussed with patient the risks and benefits of surgical repair, including the potential for failure to obtain an anatomic reconstruction, nerve injury, vessel injury, arthritis, loss of motion, DVT, PE, symptomatic hardware, particularly given the position of the fixation and multiple others. The patient acknowledged these risks and provided consent to proceed.   BRIEF SUMMARY OF PROCEDURE:  After administration of preoperative antibiotics, the patient was taken to the operating room where general anesthesia was induced. The right arm was placed over a bone foam.  No tourniquet was used during the procedure.  A chlorhexidine scrub and then a Betadine scrub and paint were used.  A timeout  was held after standard draping, and then a slightly off midline incision made.  Dissection was carried down through subcutaneous tissue to the fracture site and disrupted extensor. Hematoma was evacuated, and the fracture ends were cleaned thoroughly, and then a stepwise attempt was made at reconstruction.  I was able to secure reduction after removing a considerable amount of callous while protecting the periosteum and pin these provisionally, followed by provisional fixation of the dorsal or outside  cortex and provisional application of the plate.  I brought in the C-arm to gain a better look at this, and proceeded with internal fixation using an Acumed olecranon plate using a combination of standard and locked fixation with 2.7 and 3.5 mm screws.  Pedro Spinner PA-C assisted me throughout.  This was a very technically demanding case, and his assistance was necessary to protect the ulnar nerve, to hold portions of the provisional reduction while I fine tuned the definitive reduction, and then also to maintain the reduction while I performed final instrumentation and fixation.  He also assisted me with wound closure, which was done with a 0 Vicryl, 2-0 Vicryl and a 2-0 nylon.  A sterile bulky dressing was applied and then a splint given the social history. The patient awakened from anesthesia and transported to PACU in stable condition.  PROGNOSIS:  Patient will follow up in 1-2 weeks for removal of the splint and initiation of motion. No formal range of motion restrictions but will refrain from active extension against resistance.  There is increased risk for arthritis given the intraarticular injury, as well as loss of motion  and symptomatic hardware which has been discussed.   Pedro GalasMichael Antinio Sanderfer, MD [facility], [facility] [phone]

## 2019-06-02 NOTE — Anesthesia Postprocedure Evaluation (Addendum)
Anesthesia Post Note  Patient: ETAN VASUDEVAN  Procedure(s) Performed: OPEN REDUCTION INTERNAL FIXATION (ORIF) ELBOW/OLECRANON FRACTURE (Left Elbow)     Patient location during evaluation: PACU Anesthesia Type: MAC and Regional Level of consciousness: awake and alert Pain management: pain level controlled Vital Signs Assessment: post-procedure vital signs reviewed and stable Respiratory status: spontaneous breathing, nonlabored ventilation and respiratory function stable Cardiovascular status: blood pressure returned to baseline and stable Postop Assessment: no apparent nausea or vomiting Anesthetic complications: no    Last Vitals:  Vitals:   06/02/19 1130 06/02/19 1155  BP: (!) 141/99 (!) 133/95  Pulse: 68 71  Resp: 17 16  Temp:  36.4 C  SpO2: 100% 100%    Last Pain:  Vitals:   06/02/19 1155  TempSrc: Oral  PainSc:                  Lidia Collum

## 2019-06-03 LAB — CBC
HCT: 32.5 % — ABNORMAL LOW (ref 39.0–52.0)
Hemoglobin: 10 g/dL — ABNORMAL LOW (ref 13.0–17.0)
MCH: 31.3 pg (ref 26.0–34.0)
MCHC: 30.8 g/dL (ref 30.0–36.0)
MCV: 101.9 fL — ABNORMAL HIGH (ref 80.0–100.0)
Platelets: 382 10*3/uL (ref 150–400)
RBC: 3.19 MIL/uL — ABNORMAL LOW (ref 4.22–5.81)
RDW: 14.1 % (ref 11.5–15.5)
WBC: 18.5 10*3/uL — ABNORMAL HIGH (ref 4.0–10.5)
nRBC: 0 % (ref 0.0–0.2)

## 2019-06-03 MED ORDER — HYDROCODONE-ACETAMINOPHEN 5-325 MG PO TABS: 1.0000 | ORAL_TABLET | ORAL | 0 refills | Status: AC | PRN

## 2019-06-03 MED ORDER — FOLIC ACID 1 MG PO TABS: 1.0000 mg | ORAL_TABLET | Freq: Every day | ORAL | Status: AC
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
## 2019-06-03 NOTE — Plan of Care (Signed)
  Problem: Education: Goal: Knowledge of General Education information will improve Description: Including pain rating scale, medication(s)/side effects and non-pharmacologic comfort measures Outcome: Progressing   Problem: Health Behavior/Discharge Planning: Goal: Ability to manage health-related needs will improve Outcome: Progressing   Problem: Clinical Measurements: Goal: Will remain free from infection Outcome: Progressing   Problem: Pain Managment: Goal: General experience of comfort will improve Outcome: Progressing   Problem: Safety: Goal: Ability to remain free from injury will improve Outcome: Progressing   Problem: Skin Integrity: Goal: Risk for impaired skin integrity will decrease Outcome: Progressing   

## 2019-06-03 NOTE — Progress Notes (Signed)
Pedro Mitchell to be D/C'd  per MD order. Discussed with the patient and all questions fully answered.  VSS, Skin clean, dry and intact without evidence of skin break down, no evidence of skin tears noted.  IV catheter discontinued intact. Site without signs and symptoms of complications. Dressing and pressure applied.  An After Visit Summary was printed and given to the patient. Patient received prescription.  D/c education completed with patient/family including follow up instructions, medication list, d/c activities limitations if indicated, with other d/c instructions as indicated by MD - patient able to verbalize understanding, all questions fully answered.   Patient instructed to return to ED, call 911, or call MD for any changes in condition.   Patient to be escorted via [place], and D/C home via private auto.

## 2019-06-03 NOTE — Care Management (Signed)
Options for community clinics listed on AVS.  Patient should call for appointment to be established at one of the clinics.

## 2019-06-03 NOTE — Plan of Care (Signed)

## 2019-06-05 ENCOUNTER — Encounter (HOSPITAL_COMMUNITY): Payer: Self-pay | Admitting: Orthopedic Surgery

## 2020-01-01 ENCOUNTER — Emergency Department (HOSPITAL_COMMUNITY): Payer: Self-pay

## 2020-01-01 ENCOUNTER — Emergency Department (HOSPITAL_COMMUNITY)
Admission: EM | Admit: 2020-01-01 | Discharge: 2020-01-01 | Disposition: A | Discharge status: Home / Self Care | Payer: Self-pay | Attending: Emergency Medicine | Admitting: Emergency Medicine

## 2020-01-01 ENCOUNTER — Other Ambulatory Visit: Payer: Self-pay

## 2020-01-01 ENCOUNTER — Encounter (HOSPITAL_COMMUNITY): Payer: Self-pay | Admitting: Emergency Medicine

## 2020-01-01 DIAGNOSIS — Z79899 Other long term (current) drug therapy: Secondary | ICD-10-CM | POA: Insufficient documentation

## 2020-01-01 DIAGNOSIS — I609 Nontraumatic subarachnoid hemorrhage, unspecified: Secondary | ICD-10-CM | POA: Insufficient documentation

## 2020-01-01 DIAGNOSIS — R569 Unspecified convulsions: Secondary | ICD-10-CM | POA: Insufficient documentation

## 2020-01-01 LAB — URINALYSIS, ROUTINE W REFLEX MICROSCOPIC
Bilirubin Urine: NEGATIVE
Glucose, UA: NEGATIVE mg/dL
Ketones, ur: 20 mg/dL — AB
Leukocytes,Ua: NEGATIVE
Nitrite: NEGATIVE
Protein, ur: 100 mg/dL — AB
Specific Gravity, Urine: 1.033 — ABNORMAL HIGH (ref 1.005–1.030)
pH: 5 (ref 5.0–8.0)

## 2020-01-01 LAB — COMPREHENSIVE METABOLIC PANEL
ALT: 41 U/L (ref 0–44)
AST: 115 U/L — ABNORMAL HIGH (ref 15–41)
Albumin: 4.4 g/dL (ref 3.5–5.0)
Alkaline Phosphatase: 124 U/L (ref 38–126)
Anion gap: 24 — ABNORMAL HIGH (ref 5–15)
BUN: 8 mg/dL (ref 6–20)
CO2: 15 mmol/L — ABNORMAL LOW (ref 22–32)
Calcium: 8.9 mg/dL (ref 8.9–10.3)
Chloride: 99 mmol/L (ref 98–111)
Creatinine, Ser: 0.97 mg/dL (ref 0.61–1.24)
GFR calc Af Amer: >60 mL/min (ref >60)
GFR calc non Af Amer: >60 mL/min (ref >60)
Glucose, Bld: 127 mg/dL — ABNORMAL HIGH (ref 70–99)
Potassium: 3.8 mmol/L (ref 3.5–5.1)
Sodium: 138 mmol/L (ref 135–145)
Total Bilirubin: 0.9 mg/dL (ref 0.3–1.2)
Total Protein: 8 g/dL (ref 6.5–8.1)

## 2020-01-01 LAB — CBC WITH DIFFERENTIAL/PLATELET
Abs Immature Granulocytes: 0.12 10*3/uL — ABNORMAL HIGH (ref 0.00–0.07)
Basophils Absolute: 0 10*3/uL (ref 0.0–0.1)
Basophils Relative: 0 %
Eosinophils Absolute: 0 10*3/uL (ref 0.0–0.5)
Eosinophils Relative: 0 %
HCT: 42.1 % (ref 39.0–52.0)
Hemoglobin: 13.7 g/dL (ref 13.0–17.0)
Immature Granulocytes: 1 %
Lymphocytes Relative: 44 %
Lymphs Abs: 4.4 10*3/uL — ABNORMAL HIGH (ref 0.7–4.0)
MCH: 31.7 pg (ref 26.0–34.0)
MCHC: 32.5 g/dL (ref 30.0–36.0)
MCV: 97.5 fL (ref 80.0–100.0)
Monocytes Absolute: 1.7 10*3/uL — ABNORMAL HIGH (ref 0.1–1.0)
Monocytes Relative: 17 %
Neutro Abs: 3.8 10*3/uL (ref 1.7–7.7)
Neutrophils Relative %: 38 %
Platelets: 157 10*3/uL (ref 150–400)
RBC: 4.32 MIL/uL (ref 4.22–5.81)
RDW: 13.3 % (ref 11.5–15.5)
WBC: 10.1 10*3/uL (ref 4.0–10.5)
nRBC: 0 % (ref 0.0–0.2)

## 2020-01-01 LAB — RAPID URINE DRUG SCREEN, HOSP PERFORMED
Amphetamines: NOT DETECTED
Barbiturates: NOT DETECTED
Benzodiazepines: NOT DETECTED
Cocaine: POSITIVE — AB
Opiates: NOT DETECTED
Tetrahydrocannabinol: NOT DETECTED

## 2020-01-01 LAB — CBG MONITORING, ED
Glucose-Capillary: 128 mg/dL — ABNORMAL HIGH (ref 70–99)
Glucose-Capillary: 91 mg/dL (ref 70–99)

## 2020-01-01 LAB — POCT I-STAT EG7
Acid-base deficit: 5 mmol/L — ABNORMAL HIGH (ref 0.0–2.0)
Bicarbonate: 19 mmol/L — ABNORMAL LOW (ref 20.0–28.0)
Calcium, Ion: 1.05 mmol/L — ABNORMAL LOW (ref 1.15–1.40)
HCT: 40 % (ref 39.0–52.0)
Hemoglobin: 13.6 g/dL (ref 13.0–17.0)
O2 Saturation: 98 %
Potassium: 4.3 mmol/L (ref 3.5–5.1)
Sodium: 136 mmol/L (ref 135–145)
TCO2: 20 mmol/L — ABNORMAL LOW (ref 22–32)
pCO2, Ven: 30.2 mmHg — ABNORMAL LOW (ref 44.0–60.0)
pH, Ven: 7.405 (ref 7.250–7.430)
pO2, Ven: 96 mmHg — ABNORMAL HIGH (ref 32.0–45.0)

## 2020-01-01 LAB — ETHANOL: Alcohol, Ethyl (B): 111 mg/dL — ABNORMAL HIGH (ref <10)

## 2020-01-01 MED ORDER — LEVETIRACETAM IN NACL 1000 MG/100ML IV SOLN
1000.0000 mg | Freq: Once | INTRAVENOUS | Status: AC
  Administered 2020-01-01: 14:00:00 1000 mg via INTRAVENOUS
  Filled 2020-01-01: qty 100

## 2020-01-01 MED ORDER — LORAZEPAM 2 MG/ML IJ SOLN
1.0000 mg | Freq: Once | INTRAMUSCULAR | Status: AC
  Administered 2020-01-01: 14:00:00 1 mg via INTRAVENOUS
  Filled 2020-01-01: qty 1

## 2020-01-01 MED ORDER — THIAMINE HCL 100 MG PO TABS
100.0000 mg | ORAL_TABLET | Freq: Once | ORAL | Status: AC
  Administered 2020-01-01: 100 mg via ORAL
  Filled 2020-01-01: qty 1

## 2020-01-01 MED ORDER — FOLIC ACID 1 MG PO TABS
1.0000 mg | ORAL_TABLET | Freq: Once | ORAL | Status: AC
  Administered 2020-01-01: 10:00:00 1 mg via ORAL
  Filled 2020-01-01: qty 1

## 2020-01-01 MED ORDER — IOHEXOL 350 MG/ML SOLN
60.0000 mL | Freq: Once | INTRAVENOUS | Status: AC | PRN
  Administered 2020-01-01: 11:00:00 60 mL via INTRAVENOUS

## 2020-01-01 MED ORDER — SODIUM CHLORIDE 0.9 % IV BOLUS
1000.0000 mL | Freq: Once | INTRAVENOUS | Status: AC
  Administered 2020-01-01: 10:00:00 1000 mL via INTRAVENOUS

## 2020-01-01 MED ORDER — LEVETIRACETAM 500 MG PO TABS: 500.0000 mg | ORAL_TABLET | Freq: Two times a day (BID) | ORAL | 0 refills | Status: AC

## 2020-01-01 MED ORDER — LORAZEPAM 2 MG/ML IJ SOLN: 1.0000 mg | Freq: Once | INTRAMUSCULAR | Status: DC

## 2020-01-01 MED ORDER — LORAZEPAM 2 MG/ML IJ SOLN
1.0000 mg | Freq: Once | INTRAMUSCULAR | Status: AC
  Administered 2020-01-01: 09:00:00 1 mg via INTRAVENOUS
  Filled 2020-01-01: qty 1

## 2020-01-01 NOTE — ED Notes (Signed)
Patient transported to CT 

## 2020-01-01 NOTE — ED Notes (Signed)
Culture sent down with urine.  

## 2020-01-01 NOTE — ED Notes (Signed)
Pt returns from ct scan. 

## 2020-01-01 NOTE — ED Triage Notes (Signed)
Pt arrives via EMS from home with reports of seizure like activity this am witnessed by family. EMS reports pt was diaphoretic and altered on their arrival. A&Ox4. Denies hx of seizures. Possible bite to lip. Last etoh last night.

## 2020-01-01 NOTE — Discharge Instructions (Addendum)
You were seen in the ER for a seizure.  Your brain scan showed a bleed called a subarachnoid hemorrhage, though it is unclear if this is from a fall or other causes. It is important that you make a follow-up appointment with [facility] for further management as they have followed your case while you were in the ER. I have provided their phone number in your discharge summary.  We have prescribed an antiseizure medication called Keppra, it is extremely important that you take this medication twice a day to prevent further seizures.  Do not drive or swim until you are cleared by neurology. Return to the ER if your symptoms worsen

## 2020-01-01 NOTE — ED Provider Notes (Signed)
[facility]   CSN: [id number] Arrival date & time: 01/01/20  0749     History Chief Complaint  Patient presents with  . Seizures    Pedro Mitchell is a 59 y.o. male.  HPI  59 year old male with an extensive history of alcohol abuse presents to the ER via EMS after his mother reported that he had a seizure at home this morning.  The patient does not have a previous history of seizures.  I was able to speak to his mother who witnessed his seizure,  she stated that she was in her room when she heard the patient fall, when she entered the room he was on his back on the floor.  Unclear if he had fallen from standing or off the bed.  She reports his whole body was shaking, eyes closed, "something" coming out of his mouth and his tongue sticking out.  The episode lasted approximate 3 to 4 minutes, but she did admit that she walked away to get the phone to call 911 while he was still seizing.  When she returned he was no longer seizing.  He was very confused after the seizure stopped.  He did not speak until EMS had arrived.  The patient is generally a poor historian, reports drinking about a half a pint of vodka last night and not remembering much else about the night.  He states he woke up around 7 AM and has no complaints other than a headache.  Does not remember seizing or falling.  Denies use of blood thinners.  Denies dizziness, nausea, vomiting, weakness, vision changes.    Past Medical History:  Diagnosis Date  . Alcohol dependence [facility](HCC)     Patient Active Problem List   Diagnosis Date Noted  . Elbow fracture 06/02/2019  . Elbow fracture, left, sequela 06/01/2019  . Alcohol dependence with uncomplicated withdrawal (HCC) 06/01/2019  . Hypokalemia 06/01/2019    Past Surgical History:  Procedure Laterality Date  . ANKLE SURGERY    . ORIF ELBOW FRACTURE Left 06/02/2019   Procedure: OPEN REDUCTION INTERNAL FIXATION (ORIF)  ELBOW/OLECRANON FRACTURE;  Surgeon: Myrene GalasHandy, Michael, MD;  Location: [facility] OR;  Service: Orthopedics;  Laterality: Left;       No family history on file.  Social History   Tobacco Use  . Smoking status: Never Smoker  . Smokeless tobacco: Former Engineer, waterUser  Substance Use Topics  . Alcohol use: Yes    Comment: h/o dependence, has been through treatment but continues to drink  . Drug use: No    Home Medications Prior to Admission medications   Medication Sig Start Date End Date Taking? Authorizing Provider  Aspirin-Salicylamide-Caffeine (BC HEADACHE POWDER PO) Take 1 Package by mouth daily as needed (pain).   Yes [provider]  hydrochlorothiazide (HYDRODIURIL) 12.5 MG tablet Take 12.5 mg by mouth daily.   Yes [provider]  ibuprofen (ADVIL) 200 MG tablet Take 200 mg by mouth every 6 (six) hours as needed for mild pain or moderate pain.   Yes [provider]  folic acid (FOLVITE) 1 MG tablet Take 1 tablet (1 mg total) by mouth daily. Patient not taking: Reported on 01/01/2020 06/04/19   Joseph ArtVann, Jessica U, DO  HYDROcodone-acetaminophen (NORCO/VICODIN) 5-325 MG tablet Take 1-2 tablets by mouth every 4 (four) hours as needed for moderate pain. Patient not taking: Reported on 01/01/2020 06/03/19   Joseph ArtVann, Jessica U, DO  levETIRAcetam (KEPPRA) 500 MG tablet Take 1 tablet (500  mg total) by mouth 2 (two) times daily. 01/01/20   Dartha Lodge, PA-C    Allergies    Patient has no known allergies.  Review of Systems   Review of Systems  Constitutional: Negative for chills and fever.  HENT: Negative for ear pain and sore throat.   Eyes: Negative for pain and visual disturbance.  Respiratory: Negative for cough and shortness of breath.   Cardiovascular: Negative for chest pain and palpitations.  Gastrointestinal: Negative for abdominal pain, constipation, diarrhea and vomiting.  Genitourinary: Negative for dysuria and hematuria.  Musculoskeletal: Negative for arthralgias and  back pain.  Skin: Negative for color change and rash.  Neurological: Positive for seizures, syncope and headaches. Negative for dizziness, speech difficulty, weakness and light-headedness.  Psychiatric/Behavioral: Negative for confusion, decreased concentration and hallucinations. The patient is not nervous/anxious and is not hyperactive.   All other systems reviewed and are negative.   Physical Exam Updated Vital Signs BP (!) 154/79   Pulse 86   Temp (!) 97.5 F (36.4 C) (Oral)   Resp 19   Ht  (1.956 m)   Wt 83.9 kg   SpO2 99%   BMI 21.94 kg/m   Physical Exam Vitals and nursing note reviewed.  Constitutional:      General: He is not in acute distress.    Appearance: Normal appearance. He is well-developed. He is not ill-appearing.  HENT:     Head: Normocephalic and atraumatic.     Mouth/Throat:     Mouth: Mucous membranes are moist.     Pharynx: Oropharynx is clear.  Eyes:     Extraocular Movements: Extraocular movements intact.     Conjunctiva/sclera: Conjunctivae normal.     Pupils: Pupils are equal, round, and reactive to light.  Cardiovascular:     Rate and Rhythm: Normal rate and regular rhythm.     Heart sounds: No murmur.  Pulmonary:     Effort: Pulmonary effort is normal. No respiratory distress.     Breath sounds: Normal breath sounds.  Abdominal:     Palpations: Abdomen is soft.     Tenderness: There is no abdominal tenderness.  Musculoskeletal:        General: No tenderness or deformity.     Cervical back: Normal range of motion and neck supple. No tenderness.     Comments: No visible head injury/deformity has hematoma/laceration.  No midline neck tenderness, normal range of motion to neck and mandible.   Skin:    General: Skin is warm and dry.  Neurological:     General: No focal deficit present.     Mental Status: He is alert and oriented to person, place, and time.     Cranial Nerves: No cranial nerve deficit.     Sensory: No sensory deficit.       Motor: No weakness.     Coordination: Romberg sign negative. Finger-Nose-Finger Test and Heel to [facility] Test normal.     Deep Tendon Reflexes: Reflexes are normal and symmetric.     Reflex Scores:      Tricep reflexes are 2+ on the right side and 2+ on the left side.      Bicep reflexes are 2+ on the right side and 2+ on the left side.      Brachioradialis reflexes are 2+ on the right side and 2+ on the left side.      Patellar reflexes are 2+ on the right side and 2+ on the left side.  Achilles reflexes are 2+ on the right side and 2+ on the left side.    Comments: Moderate tremor noted in hands and face/tongue.No noted weakness, numbness in upper and lower extremities, negative Romberg, though patient is moderate tremulous. Negative finger to nose, heel to shin. PERRLA.  Alert and oriented x4.  No appreciable head injury or hematoma. Unable to ambulate pt.   Psychiatric:        Mood and Affect: Mood normal.        Behavior: Behavior normal.     ED Results / Procedures / Treatments   Labs (all labs ordered are listed, but only abnormal results are displayed) Labs Reviewed  COMPREHENSIVE METABOLIC PANEL - Abnormal; Notable for the following components:      Result Value   CO2 15 (*)    Glucose, Bld 127 (*)    AST 115 (*)    Anion gap 24 (*)    All other components within normal limits  CBC WITH DIFFERENTIAL/PLATELET - Abnormal; Notable for the following components:   Lymphs Abs 4.4 (*)    Monocytes Absolute 1.7 (*)    Abs Immature Granulocytes 0.12 (*)    All other components within normal limits  ETHANOL - Abnormal; Notable for the following components:   Alcohol, Ethyl (B) 111 (*)    All other components within normal limits  RAPID URINE DRUG SCREEN, HOSP PERFORMED - Abnormal; Notable for the following components:   Cocaine POSITIVE (*)    All other components within normal limits  URINALYSIS, ROUTINE W REFLEX MICROSCOPIC - Abnormal; Notable for the following  components:   Specific Gravity, Urine 1.033 (*)    Hgb urine dipstick SMALL (*)    Ketones, ur 20 (*)    Protein, ur 100 (*)    Bacteria, UA RARE (*)    All other components within normal limits  CBG MONITORING, ED - Abnormal; Notable for the following components:   Glucose-Capillary 128 (*)    All other components within normal limits  POCT I-STAT EG7 - Abnormal; Notable for the following components:   pCO2, Ven 30.2 (*)    pO2, Ven 96.0 (*)    Bicarbonate 19.0 (*)    TCO2 20 (*)    Acid-base deficit 5.0 (*)    Calcium, Ion 1.05 (*)    All other components within normal limits  BLOOD GAS, VENOUS  I-STAT VENOUS BLOOD GAS, ED  CBG MONITORING, ED    EKG EKG Interpretation  Date/Time:  Monday January 01 2020 08:03:27 EDT Ventricular Rate:  99 PR Interval:    QRS Duration: 85 QT Interval:  345 QTC Calculation: 443 R Axis:   55 Text Interpretation: Sinus rhythm Probable left atrial enlargement No significant change since last tracing Confirmed by Gwyneth Sprout ([id number]) on 01/01/2020 8:35:19 AM   Radiology CT ANGIO HEAD W OR WO CONTRAST  Result Date: 01/01/2020 CLINICAL DATA:  Presentation with seizure. Acute subarachnoid hemorrhage in the right sylvian fissure by previous CT. EXAM: CT ANGIOGRAPHY HEAD TECHNIQUE: Multidetector CT imaging of the head was performed using the standard protocol during bolus administration of intravenous contrast. Multiplanar CT image reconstructions and MIPs were obtained to evaluate the vascular anatomy. CONTRAST:  90mL OMNIPAQUE IOHEXOL 350 MG/ML SOLN COMPARISON:  Head CT same day FINDINGS: CTA HEAD Anterior circulation: Both internal carotid arteries are patent through the skull base and siphon regions. There is ordinary siphon atherosclerotic calcification but no stenosis greater than 30%. The anterior and middle cerebral vessels are patent. No  berry aneurysm is identified, with specific attention to the right MCA region based on the location of the  subarachnoid hemorrhage. Posterior circulation: Both vertebral arteries are patent through the foramen magnum. There is atherosclerotic disease of the right vertebral artery V4 segment with stenosis estimated at 50%. Both vertebral arteries do reach the basilar. No basilar stenosis. Posterior circulation branch vessels appear normal. No evidence of posterior circulation aneurysm. Venous sinuses: Patent and normal. Anatomic variants: None significant. Aplastic A1 segment on the right with the left internal carotid artery supplying both anterior cerebral arteries. IMPRESSION: No identifiable intracranial berry aneurysm, despite the CT pattern suggesting aneurysmal bleeding in the right sylvian fissure region. No sign of high flow vascular malformation. Electronically Signed   By: Nelson Chimes M.D.   On: 01/01/2020 11:17   CT Head Wo Contrast  Result Date: 01/01/2020 CLINICAL DATA:  The seizure. EXAM: CT HEAD WITHOUT CONTRAST TECHNIQUE: Contiguous axial images were obtained from the base of the skull through the vertex without intravenous contrast. COMPARISON:  No pertinent prior studies available for comparison. FINDINGS: Brain: There is acute subarachnoid hemorrhage within the right sylvian fissure (for instance as seen on series 3, image 16). There is no evidence of intracranial mass. No midline shift or evidence of hydrocephalus. Ill-defined hypoattenuation within the cerebral white matter is nonspecific, but consistent with chronic small vessel ischemic disease. Chronic appearing lacunar infarcts within the bilateral basal ganglia. Moderate generalized parenchymal atrophy, advanced for age. Vascular: No hyperdense vessel.  Atherosclerotic calcifications. Skull: Normal. Negative for fracture or focal lesion. Sinuses/Orbits: Visualized orbits demonstrate no acute abnormality. Mild frontal and ethmoid sinus mucosal thickening. No significant mastoid effusion. These results were called by telephone at the time of  interpretation on 01/01/2020 at 8:57 am to provider Dr. Maryan Rued, who verbally acknowledged these results. IMPRESSION: Acute subarachnoid hemorrhage within the right sylvian fissure. Age advanced generalized parenchymal atrophy with chronic small vessel ischemic disease. This includes chronic appearing lacunar infarcts in the bilateral basal ganglia. Mild paranasal sinus mucosal thickening. Electronically Signed   By: Kellie Simmering DO   On: 01/01/2020 08:58   CT Cervical Spine Wo Contrast  Result Date: 01/01/2020 CLINICAL DATA:  Seizure.  Head trauma.  Subarachnoid hemorrhage. EXAM: CT CERVICAL SPINE WITHOUT CONTRAST TECHNIQUE: Multidetector CT imaging of the cervical spine was performed without intravenous contrast. Multiplanar CT image reconstructions were also generated. COMPARISON:  None. FINDINGS: Alignment: Mild retrolisthesis C5-6 otherwise normal alignment. Skull base and vertebrae: Negative for cervical spine fracture or mass lesion. Soft tissues and spinal canal: Negative for soft tissue swelling or mass. Disc levels:  C2-3: Negative C3-4: Small central disc protrusion.  Negative for stenosis C4-5: Disc degeneration with mild uncinate spurring. Mild spinal stenosis and mild foraminal stenosis bilaterally C5-6: Disc degeneration and spondylosis with diffuse uncinate spurring causing moderate spinal stenosis and moderate foraminal stenosis bilaterally C6-7: Mild disc and facet degeneration.  Mild spinal stenosis C7-T1: Negative Upper chest: Lung apices clear bilaterally. Other: None IMPRESSION: Negative for cervical spine fracture Cervical spondylosis most prominent at C5-6 where there is moderate spinal stenosis and moderate foraminal stenosis bilaterally due to spurring. Electronically Signed   By: Franchot Gallo M.D.   On: 01/01/2020 10:14    Procedures Procedures (including critical care time)  Medications Ordered in ED Medications  LORazepam (ATIVAN) injection 1 mg (1 mg Intravenous Given  01/01/20 0927)  thiamine tablet 100 mg (100 mg Oral Given 2/29/79 8921)  folic acid (FOLVITE) tablet 1 mg (1 mg Oral Given 01/01/20  1019)  sodium chloride 0.9 % bolus 1,000 mL (0 mLs Intravenous Stopped 01/01/20 1426)  iohexol (OMNIPAQUE) 350 MG/ML injection 60 mL (60 mLs Intravenous Contrast Given 01/01/20 1102)  LORazepam (ATIVAN) injection 1 mg (1 mg Intravenous Given 01/01/20 1351)  levETIRAcetam (KEPPRA) IVPB 1000 mg/100 mL premix (0 mg Intravenous Stopped 01/01/20 1426)    ED Course  I have reviewed the triage vital signs and the nursing notes.  Pertinent labs & imaging results that were available during my care of the patient were reviewed by me and considered in my medical decision making (see chart for details).  Clinical Course as of Dec 31 2021  Mon Jan 01, 2020  0900 Acute subarachnoid hemorrhage within the right sylvian fissure. CT CSPINE negative for injury   CT Head Wo Contrast [MB]  0912 Cased discussed with Dr. Franky Macho with neurosurgery, who reviewed patient's imagine, with location feels this is unlikely aneurysmal, but will get CTA to confirm. Given that pt is neurologically intact can likely go home, if pt has no further seizures. Alcohol withdrawal may be causes of seizure still, bleed could be traumatic   [KF]  0935 CO2(!): 15 [MB]  0935 Anion gap(!): 24 [MB]  1121 CT angio negative for aneurysmal bleed.   [MB]  1335 Case discussed with Dr. Otelia Limes with neurology regarding potential need for seizure medication.  Patient has not had any further seizures here in the ED, and lab work does not show other potential etiology for seizure.  It is unclear whether small area of subarachnoid bleed because seizure or bleed was caused by fall and seizure.  Regardless Dr. Otelia Limes states that this can now be an epileptogenic lesion for the patient and recommend starting him on seizure medication we will load patient with IV Keppra and send him home with Keppra prescription.   [KF]      Clinical Course User Index [KF] Dartha Lodge, PA-C [MB] Leone Brand   MDM Rules/Calculators/A&P                     59 year old male with new onset seizure this morning. Mildly hypertensive and tachycardic, afebrile. On arrival to ER patient was alert and oriented, though he does have a slight flat affect.  Moderate tremor in hands and face/tongue  noted on physical exam. No noted weakness, numbness, negative Romberg, finger to nose, heel to shin. PERRLA.  Alert and oriented x4.  No appreciable head injury or hematoma.  CMP without significant electrolyte abnormalities or kidney injury blood but  CO2 of 15 and anion gap of 24 likely in the setting of seizure.  Repeat VBG showed improved bicarb and normal pH after IV fluids.  No significant ketonuria to suggest alcoholic ketoacidosis.  UDS positive for cocaine which could also contribute to seizures. Patient observed for several hours with no additional seizure like activity. Received extra dose of Ativan for withdrawal symptoms.   See ED course for futher MDM.  CT head positive for small subarachnoid hemorrhage in the right sylvian fissure.  Discussed with neurology, they recommended CT angio which was negative for aneurysmal bleed.  It is unclear if the subarachnoid bleed was caused by the fall and/or seizure.  Regardless, neurology recommends to start on Keppra for seizure prophylaxis and close outpatient followup.  At discharge, pt is no longer tachycardic or tremoulous after dose of Ativan. Remains alert and oriented, in no acute distress. Discussed course of Librium taper, after long discussion with patient he does  not have plans to stop using alcohol so he would not be eligible for Librium at this time. Stable for discharge home. Follow up outpatient w/ [facility], information provided. Discussed seizure precautions with the patient. Prescribed Keppra per neurology recommendations and stressed followup. Return precautions  given.   Final Clinical Impression(s) / ED Diagnoses Final diagnoses:  Seizure (HCC)  Subarachnoid hemorrhage (HCC)    Rx / DC Orders ED Discharge Orders         Ordered    levETIRAcetam (KEPPRA) 500 MG tablet  2 times daily     01/01/20 1340    Ambulatory referral to Neurology    Comments: An appointment is requested in approximately: 2-3 weeks, new seizure with small [facility]   01/01/20 1427           Leone Brand 01/01/20 2023    Gwyneth Sprout, MD 01/03/20 [phone]

## 2020-01-19 ENCOUNTER — Ambulatory Visit: Payer: PRIVATE HEALTH INSURANCE | Admitting: Neurology

## 2020-02-05 ENCOUNTER — Inpatient Hospital Stay (HOSPITAL_COMMUNITY): Payer: Self-pay

## 2020-02-05 ENCOUNTER — Encounter (HOSPITAL_COMMUNITY): Payer: Self-pay | Admitting: Pulmonary Disease

## 2020-02-05 ENCOUNTER — Inpatient Hospital Stay (HOSPITAL_COMMUNITY)
Admission: EM | Admit: 2020-02-05 | Discharge: 2020-03-05 | DRG: 296 | Disposition: E | Payer: Self-pay | Attending: Pulmonary Disease | Admitting: Pulmonary Disease

## 2020-02-05 ENCOUNTER — Emergency Department (HOSPITAL_COMMUNITY): Payer: Self-pay

## 2020-02-05 DIAGNOSIS — G931 Anoxic brain damage, not elsewhere classified: Secondary | ICD-10-CM | POA: Diagnosis present

## 2020-02-05 DIAGNOSIS — D696 Thrombocytopenia, unspecified: Secondary | ICD-10-CM | POA: Diagnosis present

## 2020-02-05 DIAGNOSIS — I469 Cardiac arrest, cause unspecified: Principal | ICD-10-CM | POA: Diagnosis present

## 2020-02-05 DIAGNOSIS — Z515 Encounter for palliative care: Secondary | ICD-10-CM | POA: Diagnosis not present

## 2020-02-05 DIAGNOSIS — Z7982 Long term (current) use of aspirin: Secondary | ICD-10-CM

## 2020-02-05 DIAGNOSIS — R739 Hyperglycemia, unspecified: Secondary | ICD-10-CM | POA: Diagnosis present

## 2020-02-05 DIAGNOSIS — E876 Hypokalemia: Secondary | ICD-10-CM | POA: Diagnosis present

## 2020-02-05 DIAGNOSIS — Z79891 Long term (current) use of opiate analgesic: Secondary | ICD-10-CM

## 2020-02-05 DIAGNOSIS — R569 Unspecified convulsions: Secondary | ICD-10-CM | POA: Insufficient documentation

## 2020-02-05 DIAGNOSIS — Z01818 Encounter for other preprocedural examination: Secondary | ICD-10-CM

## 2020-02-05 DIAGNOSIS — I361 Nonrheumatic tricuspid (valve) insufficiency: Secondary | ICD-10-CM

## 2020-02-05 DIAGNOSIS — D649 Anemia, unspecified: Secondary | ICD-10-CM | POA: Diagnosis present

## 2020-02-05 DIAGNOSIS — E8809 Other disorders of plasma-protein metabolism, not elsewhere classified: Secondary | ICD-10-CM | POA: Diagnosis present

## 2020-02-05 DIAGNOSIS — F1010 Alcohol abuse, uncomplicated: Secondary | ICD-10-CM | POA: Diagnosis present

## 2020-02-05 DIAGNOSIS — R578 Other shock: Secondary | ICD-10-CM | POA: Diagnosis present

## 2020-02-05 DIAGNOSIS — J9602 Acute respiratory failure with hypercapnia: Secondary | ICD-10-CM | POA: Diagnosis present

## 2020-02-05 DIAGNOSIS — Z791 Long term (current) use of non-steroidal anti-inflammatories (NSAID): Secondary | ICD-10-CM

## 2020-02-05 DIAGNOSIS — G9340 Encephalopathy, unspecified: Secondary | ICD-10-CM | POA: Insufficient documentation

## 2020-02-05 DIAGNOSIS — Z79899 Other long term (current) drug therapy: Secondary | ICD-10-CM

## 2020-02-05 DIAGNOSIS — Z87891 Personal history of nicotine dependence: Secondary | ICD-10-CM

## 2020-02-05 DIAGNOSIS — J9690 Respiratory failure, unspecified, unspecified whether with hypoxia or hypercapnia: Secondary | ICD-10-CM

## 2020-02-05 DIAGNOSIS — R4020 Unspecified coma: Secondary | ICD-10-CM | POA: Diagnosis present

## 2020-02-05 DIAGNOSIS — G40909 Epilepsy, unspecified, not intractable, without status epilepticus: Secondary | ICD-10-CM | POA: Diagnosis present

## 2020-02-05 DIAGNOSIS — Z20822 Contact with and (suspected) exposure to covid-19: Secondary | ICD-10-CM | POA: Diagnosis present

## 2020-02-05 DIAGNOSIS — E781 Pure hyperglyceridemia: Secondary | ICD-10-CM | POA: Diagnosis present

## 2020-02-05 DIAGNOSIS — J9601 Acute respiratory failure with hypoxia: Secondary | ICD-10-CM | POA: Diagnosis present

## 2020-02-05 LAB — CBC
HCT: 45.5 % (ref 39.0–52.0)
Hemoglobin: 13.7 g/dL (ref 13.0–17.0)
MCH: 32.7 pg (ref 26.0–34.0)
MCHC: 30.1 g/dL (ref 30.0–36.0)
MCV: 108.6 fL — ABNORMAL HIGH (ref 80.0–100.0)
Platelets: 183 10*3/uL (ref 150–400)
RBC: 4.19 MIL/uL — ABNORMAL LOW (ref 4.22–5.81)
RDW: 13.4 % (ref 11.5–15.5)
WBC: 15.6 10*3/uL — ABNORMAL HIGH (ref 4.0–10.5)
nRBC: 0.3 % — ABNORMAL HIGH (ref 0.0–0.2)

## 2020-02-05 LAB — LACTIC ACID, PLASMA
Lactic Acid, Venous: >11 mmol/L (ref 0.5–1.9)
Lactic Acid, Venous: 7.4 mmol/L (ref 0.5–1.9)
Lactic Acid, Venous: 3.3 mmol/L (ref 0.5–1.9)

## 2020-02-05 LAB — BASIC METABOLIC PANEL
Anion gap: 24 — ABNORMAL HIGH (ref 5–15)
Anion gap: 18 — ABNORMAL HIGH (ref 5–15)
Anion gap: 16 — ABNORMAL HIGH (ref 5–15)
BUN: 10 mg/dL (ref 6–20)
BUN: 10 mg/dL (ref 6–20)
BUN: 8 mg/dL (ref 6–20)
CO2: 14 mmol/L — ABNORMAL LOW (ref 22–32)
CO2: 17 mmol/L — ABNORMAL LOW (ref 22–32)
CO2: 18 mmol/L — ABNORMAL LOW (ref 22–32)
Calcium: 8.6 mg/dL — ABNORMAL LOW (ref 8.9–10.3)
Calcium: 8.2 mg/dL — ABNORMAL LOW (ref 8.9–10.3)
Calcium: 8.6 mg/dL — ABNORMAL LOW (ref 8.9–10.3)
Chloride: 99 mmol/L (ref 98–111)
Chloride: 103 mmol/L (ref 98–111)
Chloride: 102 mmol/L (ref 98–111)
Creatinine, Ser: 1.11 mg/dL (ref 0.61–1.24)
Creatinine, Ser: 0.88 mg/dL (ref 0.61–1.24)
Creatinine, Ser: 0.66 mg/dL (ref 0.61–1.24)
GFR calc Af Amer: >60 mL/min (ref >60)
GFR calc Af Amer: >60 mL/min (ref >60)
GFR calc Af Amer: >60 mL/min (ref >60)
GFR calc non Af Amer: >60 mL/min (ref >60)
GFR calc non Af Amer: >60 mL/min (ref >60)
GFR calc non Af Amer: >60 mL/min (ref >60)
Glucose, Bld: 342 mg/dL — ABNORMAL HIGH (ref 70–99)
Glucose, Bld: 238 mg/dL — ABNORMAL HIGH (ref 70–99)
Glucose, Bld: 217 mg/dL — ABNORMAL HIGH (ref 70–99)
Potassium: 4.6 mmol/L (ref 3.5–5.1)
Potassium: 4.6 mmol/L (ref 3.5–5.1)
Potassium: 3.1 mmol/L — ABNORMAL LOW (ref 3.5–5.1)
Sodium: 137 mmol/L (ref 135–145)
Sodium: 138 mmol/L (ref 135–145)
Sodium: 136 mmol/L (ref 135–145)

## 2020-02-05 LAB — POCT I-STAT 7, (LYTES, BLD GAS, ICA,H+H)
Acid-base deficit: 9 mmol/L — ABNORMAL HIGH (ref 0.0–2.0)
Bicarbonate: 17.2 mmol/L — ABNORMAL LOW (ref 20.0–28.0)
Calcium, Ion: 1.14 mmol/L — ABNORMAL LOW (ref 1.15–1.40)
HCT: 41 % (ref 39.0–52.0)
Hemoglobin: 13.9 g/dL (ref 13.0–17.0)
O2 Saturation: 98 %
Potassium: 4.2 mmol/L (ref 3.5–5.1)
Sodium: 137 mmol/L (ref 135–145)
TCO2: 18 mmol/L — ABNORMAL LOW (ref 22–32)
pCO2 arterial: 35.8 mmHg (ref 32.0–48.0)
pH, Arterial: 7.289 — ABNORMAL LOW (ref 7.350–7.450)
pO2, Arterial: 113 mmHg — ABNORMAL HIGH (ref 83.0–108.0)

## 2020-02-05 LAB — RAPID URINE DRUG SCREEN, HOSP PERFORMED
Amphetamines: NOT DETECTED
Barbiturates: NOT DETECTED
Benzodiazepines: POSITIVE — AB
Cocaine: NOT DETECTED
Opiates: NOT DETECTED
Tetrahydrocannabinol: NOT DETECTED

## 2020-02-05 LAB — TROPONIN I (HIGH SENSITIVITY)
Troponin I (High Sensitivity): 27 ng/L — ABNORMAL HIGH (ref <18)
Troponin I (High Sensitivity): 70 ng/L — ABNORMAL HIGH (ref <18)

## 2020-02-05 LAB — POCT I-STAT, CHEM 8
BUN: 8 mg/dL (ref 6–20)
BUN: 7 mg/dL (ref 6–20)
Calcium, Ion: 1.1 mmol/L — ABNORMAL LOW (ref 1.15–1.40)
Calcium, Ion: 1.05 mmol/L — ABNORMAL LOW (ref 1.15–1.40)
Chloride: 102 mmol/L (ref 98–111)
Chloride: 101 mmol/L (ref 98–111)
Creatinine, Ser: <0.2 mg/dL — ABNORMAL LOW (ref 0.61–1.24)
Creatinine, Ser: 0.2 mg/dL — ABNORMAL LOW (ref 0.61–1.24)
Glucose, Bld: 302 mg/dL — ABNORMAL HIGH (ref 70–99)
Glucose, Bld: 256 mg/dL — ABNORMAL HIGH (ref 70–99)
HCT: 45 % (ref 39.0–52.0)
HCT: 46 % (ref 39.0–52.0)
Hemoglobin: 15.3 g/dL (ref 13.0–17.0)
Hemoglobin: 15.6 g/dL (ref 13.0–17.0)
Potassium: 3 mmol/L — ABNORMAL LOW (ref 3.5–5.1)
Potassium: 3 mmol/L — ABNORMAL LOW (ref 3.5–5.1)
Sodium: 141 mmol/L (ref 135–145)
Sodium: 139 mmol/L (ref 135–145)
TCO2: 24 mmol/L (ref 22–32)
TCO2: 24 mmol/L (ref 22–32)

## 2020-02-05 LAB — CBG MONITORING, ED: Glucose-Capillary: 253 mg/dL — ABNORMAL HIGH (ref 70–99)

## 2020-02-05 LAB — GLUCOSE, CAPILLARY
Glucose-Capillary: 242 mg/dL — ABNORMAL HIGH (ref 70–99)
Glucose-Capillary: 286 mg/dL — ABNORMAL HIGH (ref 70–99)
Glucose-Capillary: 287 mg/dL — ABNORMAL HIGH (ref 70–99)
Glucose-Capillary: 279 mg/dL — ABNORMAL HIGH (ref 70–99)
Glucose-Capillary: 224 mg/dL — ABNORMAL HIGH (ref 70–99)
Glucose-Capillary: 251 mg/dL — ABNORMAL HIGH (ref 70–99)
Glucose-Capillary: 212 mg/dL — ABNORMAL HIGH (ref 70–99)
Glucose-Capillary: 165 mg/dL — ABNORMAL HIGH (ref 70–99)
Glucose-Capillary: 120 mg/dL — ABNORMAL HIGH (ref 70–99)
Glucose-Capillary: 102 mg/dL — ABNORMAL HIGH (ref 70–99)

## 2020-02-05 LAB — I-STAT CHEM 8, ED
BUN: 12 mg/dL (ref 6–20)
Calcium, Ion: 1.14 mmol/L — ABNORMAL LOW (ref 1.15–1.40)
Chloride: 101 mmol/L (ref 98–111)
Creatinine, Ser: 1 mg/dL (ref 0.61–1.24)
Glucose, Bld: 326 mg/dL — ABNORMAL HIGH (ref 70–99)
HCT: 45 % (ref 39.0–52.0)
Hemoglobin: 15.3 g/dL (ref 13.0–17.0)
Potassium: 4.3 mmol/L (ref 3.5–5.1)
Sodium: 138 mmol/L (ref 135–145)
TCO2: 18 mmol/L — ABNORMAL LOW (ref 22–32)

## 2020-02-05 LAB — RESPIRATORY PANEL BY RT PCR (FLU A&B, COVID)
Influenza A by PCR: NEGATIVE
Influenza B by PCR: NEGATIVE
SARS Coronavirus 2 by RT PCR: NEGATIVE

## 2020-02-05 LAB — ECHOCARDIOGRAM COMPLETE
Height: 77 in
Weight: 2991.2 oz

## 2020-02-05 LAB — PROTIME-INR
INR: 1.1 (ref 0.8–1.2)
INR: 1.1 (ref 0.8–1.2)
Prothrombin Time: 13.6 seconds (ref 11.4–15.2)
Prothrombin Time: 13.5 seconds (ref 11.4–15.2)

## 2020-02-05 LAB — ETHANOL: Alcohol, Ethyl (B): 49 mg/dL — ABNORMAL HIGH (ref <10)

## 2020-02-05 LAB — APTT
aPTT: 23 seconds — ABNORMAL LOW (ref 24–36)
aPTT: 29 seconds (ref 24–36)

## 2020-02-05 LAB — HEMOGLOBIN A1C
Hgb A1c MFr Bld: 5.7 % — ABNORMAL HIGH (ref 4.8–5.6)
Mean Plasma Glucose: 116.89 mg/dL

## 2020-02-05 LAB — MRSA PCR SCREENING: MRSA by PCR: NEGATIVE

## 2020-02-05 LAB — MAGNESIUM: Magnesium: 2.4 mg/dL (ref 1.7–2.4)

## 2020-02-05 MED ORDER — CHLORHEXIDINE GLUCONATE CLOTH 2 % EX PADS
6.0000 | MEDICATED_PAD | Freq: Every day | CUTANEOUS | Status: DC
  Administered 2020-02-05 – 2020-02-09 (×5): 6 via TOPICAL

## 2020-02-05 MED ORDER — HYDRALAZINE HCL 20 MG/ML IJ SOLN
10.0000 mg | INTRAMUSCULAR | Status: DC | PRN
  Administered 2020-02-05 (×2): 10 mg via INTRAVENOUS
  Filled 2020-02-05: qty 1

## 2020-02-05 MED ORDER — HYDRALAZINE HCL 20 MG/ML IJ SOLN
10.0000 mg | INTRAMUSCULAR | Status: DC | PRN
  Administered 2020-02-05: 14:00:00 10 mg via INTRAVENOUS
  Filled 2020-02-05 (×2): qty 1

## 2020-02-05 MED ORDER — CISATRACURIUM BOLUS VIA INFUSION
0.1000 mg/kg | Freq: Once | INTRAVENOUS | Status: DC
  Filled 2020-02-05: qty 9

## 2020-02-05 MED ORDER — INSULIN ASPART 100 UNIT/ML ~~LOC~~ SOLN
0.0000 [IU] | SUBCUTANEOUS | Status: DC
  Administered 2020-02-05: 8 [IU] via SUBCUTANEOUS
  Administered 2020-02-05: 20:00:00 3 [IU] via SUBCUTANEOUS
  Administered 2020-02-05: 8 [IU] via SUBCUTANEOUS
  Administered 2020-02-06 – 2020-02-08 (×3): 2 [IU] via SUBCUTANEOUS

## 2020-02-05 MED ORDER — DOCUSATE SODIUM 50 MG/5ML PO LIQD
100.0000 mg | Freq: Two times a day (BID) | ORAL | Status: DC
  Administered 2020-02-05 – 2020-02-09 (×8): 100 mg via ORAL
  Filled 2020-02-05 (×9): qty 10

## 2020-02-05 MED ORDER — FENTANYL CITRATE (PF) 100 MCG/2ML IJ SOLN
100.0000 ug | Freq: Once | INTRAMUSCULAR | Status: AC
  Administered 2020-02-05: 100 ug via INTRAVENOUS
  Filled 2020-02-05: qty 2

## 2020-02-05 MED ORDER — LEVETIRACETAM IN NACL 1000 MG/100ML IV SOLN
1000.0000 mg | Freq: Once | INTRAVENOUS | Status: AC
  Administered 2020-02-05: 09:00:00 1000 mg via INTRAVENOUS
  Filled 2020-02-05: qty 100

## 2020-02-05 MED ORDER — THIAMINE HCL 100 MG/ML IJ SOLN
100.0000 mg | Freq: Every day | INTRAMUSCULAR | Status: DC
  Administered 2020-02-05 – 2020-02-09 (×5): 100 mg via INTRAVENOUS
  Filled 2020-02-05 (×5): qty 2

## 2020-02-05 MED ORDER — ORAL CARE MOUTH RINSE
15.0000 mL | OROMUCOSAL | Status: DC
  Administered 2020-02-05 – 2020-02-09 (×42): 15 mL via OROMUCOSAL

## 2020-02-05 MED ORDER — POLYETHYLENE GLYCOL 3350 17 G PO PACK
17.0000 g | PACK | Freq: Every day | ORAL | Status: DC
  Administered 2020-02-06 – 2020-02-09 (×4): 17 g via ORAL
  Filled 2020-02-05 (×4): qty 1

## 2020-02-05 MED ORDER — SODIUM CHLORIDE 0.9 % IV SOLN
INTRAVENOUS | Status: DC
  Administered 2020-02-08: 10 mL/h via INTRAVENOUS

## 2020-02-05 MED ORDER — FENTANYL BOLUS VIA INFUSION
50.0000 ug | INTRAVENOUS | Status: DC | PRN
  Administered 2020-02-05 – 2020-02-09 (×4): 50 ug via INTRAVENOUS
  Filled 2020-02-05: qty 50

## 2020-02-05 MED ORDER — LEVETIRACETAM IN NACL 500 MG/100ML IV SOLN
500.0000 mg | Freq: Two times a day (BID) | INTRAVENOUS | Status: DC
  Administered 2020-02-05 – 2020-02-06 (×2): 500 mg via INTRAVENOUS
  Filled 2020-02-05 (×2): qty 100

## 2020-02-05 MED ORDER — ARTIFICIAL TEARS OPHTHALMIC OINT
1.0000 "application " | TOPICAL_OINTMENT | Freq: Three times a day (TID) | OPHTHALMIC | Status: DC
  Administered 2020-02-05 – 2020-02-08 (×8): 1 via OPHTHALMIC
  Filled 2020-02-05 (×3): qty 3.5

## 2020-02-05 MED ORDER — AMLODIPINE BESYLATE 10 MG PO TABS
10.0000 mg | ORAL_TABLET | Freq: Every day | ORAL | Status: DC
  Administered 2020-02-05: 16:00:00 10 mg
  Filled 2020-02-05: qty 1

## 2020-02-05 MED ORDER — FENTANYL CITRATE (PF) 100 MCG/2ML IJ SOLN
INTRAMUSCULAR | Status: AC
  Administered 2020-02-05: 100 ug
  Filled 2020-02-05: qty 2

## 2020-02-05 MED ORDER — FENTANYL CITRATE (PF) 100 MCG/2ML IJ SOLN: 50.0000 ug | INTRAMUSCULAR | Status: DC | PRN

## 2020-02-05 MED ORDER — NOREPINEPHRINE 4 MG/250ML-% IV SOLN
0.0000 ug/min | INTRAVENOUS | Status: DC
  Administered 2020-02-06: 5 ug/min via INTRAVENOUS
  Administered 2020-02-06: 17 ug/min via INTRAVENOUS
  Filled 2020-02-05 (×2): qty 250

## 2020-02-05 MED ORDER — FOLIC ACID 5 MG/ML IJ SOLN
1.0000 mg | Freq: Every day | INTRAMUSCULAR | Status: DC
  Administered 2020-02-05 – 2020-02-09 (×5): 1 mg via INTRAVENOUS
  Filled 2020-02-05 (×6): qty 0.2

## 2020-02-05 MED ORDER — INSULIN DETEMIR 100 UNIT/ML ~~LOC~~ SOLN
15.0000 [IU] | Freq: Two times a day (BID) | SUBCUTANEOUS | Status: DC
  Administered 2020-02-05 – 2020-02-07 (×5): 15 [IU] via SUBCUTANEOUS
  Filled 2020-02-05 (×8): qty 0.15

## 2020-02-05 MED ORDER — POTASSIUM CHLORIDE 20 MEQ PO PACK
40.0000 meq | PACK | Freq: Two times a day (BID) | ORAL | Status: AC
  Administered 2020-02-05 (×2): 40 meq via ORAL
  Filled 2020-02-05 (×2): qty 2

## 2020-02-05 MED ORDER — HYDRALAZINE HCL 25 MG PO TABS
25.0000 mg | ORAL_TABLET | Freq: Three times a day (TID) | ORAL | Status: DC
  Administered 2020-02-05 – 2020-02-08 (×4): 25 mg
  Filled 2020-02-05 (×4): qty 1

## 2020-02-05 MED ORDER — ASPIRIN 300 MG RE SUPP
300.0000 mg | Freq: Once | RECTAL | Status: AC
  Administered 2020-02-05: 300 mg via RECTAL
  Filled 2020-02-05: qty 1

## 2020-02-05 MED ORDER — SODIUM CHLORIDE 0.9 % IV SOLN
1.0000 ug/kg/min | INTRAVENOUS | Status: DC
  Administered 2020-02-05: 12:00:00 1 ug/kg/min via INTRAVENOUS
  Administered 2020-02-06: 1.2 ug/kg/min via INTRAVENOUS
  Filled 2020-02-05 (×2): qty 20

## 2020-02-05 MED ORDER — CISATRACURIUM BOLUS VIA INFUSION
0.0500 mg/kg | INTRAVENOUS | Status: DC | PRN
  Filled 2020-02-05 (×2): qty 5

## 2020-02-05 MED ORDER — ROCURONIUM BROMIDE 50 MG/5ML IV SOLN
INTRAVENOUS | Status: AC | PRN
  Administered 2020-02-05: 100 mg via INTRAVENOUS

## 2020-02-05 MED ORDER — PROPOFOL 1000 MG/100ML IV EMUL
25.0000 ug/kg/min | INTRAVENOUS | Status: DC
  Administered 2020-02-05: 35 ug/kg/min via INTRAVENOUS
  Administered 2020-02-05: 12.5 ug/kg/min via INTRAVENOUS
  Administered 2020-02-06 (×2): 55 ug/kg/min via INTRAVENOUS
  Administered 2020-02-06 (×2): 45 ug/kg/min via INTRAVENOUS
  Administered 2020-02-06: 35 ug/kg/min via INTRAVENOUS
  Administered 2020-02-06: 45 ug/kg/min via INTRAVENOUS
  Administered 2020-02-07 (×5): 55 ug/kg/min via INTRAVENOUS
  Administered 2020-02-07 – 2020-02-08 (×4): 65 ug/kg/min via INTRAVENOUS
  Administered 2020-02-08: 60 ug/kg/min via INTRAVENOUS
  Administered 2020-02-08: 50 ug/kg/min via INTRAVENOUS
  Administered 2020-02-08: 60 ug/kg/min via INTRAVENOUS
  Administered 2020-02-08: 45 ug/kg/min via INTRAVENOUS
  Administered 2020-02-08: 65 ug/kg/min via INTRAVENOUS
  Administered 2020-02-09: 45 ug/kg/min via INTRAVENOUS
  Administered 2020-02-09 (×4): 50 ug/kg/min via INTRAVENOUS
  Filled 2020-02-05 (×28): qty 100

## 2020-02-05 MED ORDER — FENTANYL 2500MCG IN NS 250ML (10MCG/ML) PREMIX INFUSION
100.0000 ug/h | INTRAVENOUS | Status: DC
  Administered 2020-02-05: 12:00:00 100 ug/h via INTRAVENOUS
  Administered 2020-02-06: 10:00:00 225 ug/h via INTRAVENOUS
  Administered 2020-02-06: 200 ug/h via INTRAVENOUS
  Administered 2020-02-06: 175 ug/h via INTRAVENOUS
  Administered 2020-02-07: 50 ug/h via INTRAVENOUS
  Filled 2020-02-05 (×5): qty 250

## 2020-02-05 MED ORDER — SODIUM CHLORIDE 0.9% FLUSH
10.0000 mL | Freq: Two times a day (BID) | INTRAVENOUS | Status: DC
  Administered 2020-02-05 – 2020-02-06 (×4): 10 mL
  Administered 2020-02-07: 30 mL
  Administered 2020-02-07 – 2020-02-08 (×2): 10 mL
  Administered 2020-02-09: 30 mL

## 2020-02-05 MED ORDER — CHLORHEXIDINE GLUCONATE 0.12% ORAL RINSE (MEDLINE KIT)
15.0000 mL | Freq: Two times a day (BID) | OROMUCOSAL | Status: DC
  Administered 2020-02-05 – 2020-02-09 (×9): 15 mL via OROMUCOSAL

## 2020-02-05 MED ORDER — SODIUM CHLORIDE 0.9% FLUSH: 10.0000 mL | INTRAVENOUS | Status: DC | PRN

## 2020-02-05 MED ORDER — PANTOPRAZOLE SODIUM 40 MG IV SOLR
40.0000 mg | Freq: Every day | INTRAVENOUS | Status: DC
  Administered 2020-02-05 – 2020-02-08 (×4): 40 mg via INTRAVENOUS
  Filled 2020-02-05 (×4): qty 40

## 2020-02-05 MED ORDER — PROPOFOL 1000 MG/100ML IV EMUL
0.0000 ug/kg/min | INTRAVENOUS | Status: DC
  Administered 2020-02-05: 30 ug/kg/min via INTRAVENOUS

## 2020-02-05 NOTE — Procedures (Signed)
Patient Name: RAMZEY PETROVIC  MRN: [id number]  Epilepsy Attending: Charlsie Quest  Referring Physician/Provider: Joneen Roach, NP Date: Feb 12, 2020 Duration: 24.51 mins  Patient history: 59 year old male status post cardiac arrest on TTM.  EEG evaluate for seizures.  Level of alertness: Comatose  AEDs during EEG study: Propofol  Technical aspects: This EEG study was done with scalp electrodes positioned according to the 10-20 International system of electrode placement. Electrical activity was acquired at a sampling rate of 500Hz  and reviewed with a high frequency filter of 70Hz  and a low frequency filter of 1Hz . EEG data were recorded continuously and digitally stored.   Description: EEG showed continuous generalized background attenuation. EEG was not reactive to noxious stimuli. Hyperventilation and photic stimulation were not performed.  ABNORMALITY - Background attenuation, generalized   IMPRESSION: This study is suggestive of profound diffuse encephalopathy, non specific to etiology but could be secondary to anoxic/hypoxic brain injury, sedation. No seizures or epileptiform discharges were seen throughout the recording.  Elmond Poehlman 

## 2020-02-05 NOTE — Progress Notes (Signed)
Echocardiogram 2D Echocardiogram has been performed.  Warren Lacy Roseanna Koplin 02-28-20, 4:01 PM

## 2020-02-05 NOTE — ED Notes (Signed)
Critical care at bedside  

## 2020-02-05 NOTE — Progress Notes (Signed)
LTM EEG hooked up and running - no initial skin breakdown - push button tested - neuro notified.  

## 2020-02-05 NOTE — ED Notes (Signed)
Ice packs applied

## 2020-02-05 NOTE — ED Notes (Signed)
NG tube advanced per X-ray and critical care request.

## 2020-02-05 NOTE — Progress Notes (Signed)
ETT pulled back 2 cm to 25 at the lip.

## 2020-02-05 NOTE — Procedures (Signed)
Arterial Catheter Insertion Procedure Note Pedro Mitchell [id number] [birth year]  Procedure: Insertion of Arterial Catheter  Indications: Blood pressure monitoring and Frequent blood sampling  Procedure Details Consent: Unable to obtain consent because of emergent medical necessity. Time Out: Verified patient identification, verified procedure, site/side was marked, verified correct patient position, special equipment/implants available, medications/allergies/relevent history reviewed, required imaging and test results available.  Performed  Maximum sterile technique was used including antiseptics, cap, gloves, gown, hand hygiene, mask and sheet. Skin prep: Chlorhexidine; local anesthetic administered 20 gauge catheter was inserted into left radial artery using the Seldinger technique. ULTRASOUND GUIDANCE USED: NO Evaluation Blood flow good; BP tracing good. Complications: No apparent complications.   Guss Bunde 02/12/2020

## 2020-02-05 NOTE — Progress Notes (Signed)
Spoke with Dr. Denese Killings about K+ 3.0, no replacement to be given at this time. BMP in AM.   Lynita Lombard, RN

## 2020-02-05 NOTE — H&P (Signed)
NAME:  Pedro Mitchell, MRN:  [id number], DOB:  17-Jun-1961, LOS: 0 ADMISSION DATE:  2020-02-27, CONSULTATION DATE:  5/3 REFERRING MD:  Dr. Stevie Kern, CHIEF COMPLAINT:  PEA arrest  Brief History   59 year old male admitted 5/3 after PEA arrest and possible seizure.   History of present illness   59 year old male with PMH as below, which is significant for ETOH abuse and recently seen in ED for single seizure discharged on Keppra. His mother reports he had been drinking in the PM hours of 5/2. In the AM hours of 5/3 she heard him collapse on the 2nd floor. She went up to find him shaking with seizure like movement. She called EMS and upon their arrival he had weak pulse, which he soon lost. He underwent immediate CPR for 10 mins until ROSC. EDP noted some seizure like movements. He was intubated in ED and PCCM was consulted.   Past Medical History   has a past medical history of Alcohol dependence (HCC).  Significant Hospital Events   5/3 Admit   Consults:    Procedures:  ETT 5/3 > CVL 5/3 >  Significant Diagnostic Tests:  CT head 5/3 > EEG 5/3 >  Micro Data:    Antimicrobials:    Interim history/subjective:    Objective   Blood pressure (!) 182/97, pulse (!) 139, temperature (!) 97 F (36.1 C), temperature source Temporal, resp. rate 17, height 6\' 5"  (1.956 m), weight 84.8 kg, SpO2 100 %.    Vent Mode: PRVC FiO2 (%):  [100 %] 100 % Set Rate:  [18 bmp] 18 bmp Vt Set:  [710 mL] 710 mL PEEP:  [5 cmH20] 5 cmH20 Plateau Pressure:  [18 cmH20] 18 cmH20  No intake or output data in the 24 hours ending 02/27/20 0904 Filed Weights   02-27-2020 0845  Weight: 84.8 kg    Examination: General: middle aged male on vent HENT: [place]/AT, Pupils sluggish, no JVD Lungs: Clear Cardiovascular: RRR, no MRG Abdomen: Soft, non-tender Extremities: no acute deformity or edema.  Neuro: Coma  Resolved Hospital Problem list     Assessment & Plan:   PEA Cardiac arrest: etiology unclear.  Did have possible seizures immediately before arrest and also after in ED.  - Admit to ICU for TTM 33C - CT head - Echo - EEG - FULL code  Acute hypoxemic respiratory failure: secondary to cardiac arrest - Full vent support - ETT retracted 2 cm in ED - VAP bundle - Follow ABG/CXR  Seizure: presented late March with single seizure and has minor workup with imaging at that time. Found to have acute SAH. He was neuro intact and discharged home on Keppra. Has also been some question is sz related to ETOH.  - Continue home Keppra - EEG - If EEG positive will consult neuro - Propofol for sedation.   Hyperglycemia with no history of DM - CBG monitoring and SSI - BMP pending  Alcohol abuse - Thiamine, folic acid  Best practice:  Diet: NPO Pain/Anxiety/Delirium protocol (if indicated): prop/fent per TTM protocol VAP protocol (if indicated): Yes DVT prophylaxis: SCDs until CT head results. Then add chemo-ppx if negative.  GI prophylaxis: PPI Glucose control: SSI Mobility: BR Code Status: FULL Family Communication: Mother updated at length by EDP. No new info to give at this time.  Disposition: ICU  Labs   CBC: Recent Labs  Lab 27-Feb-2020 0843  HGB 15.3  HCT 45.0    Basic Metabolic Panel: Recent Labs  Lab 03-01-2020 0843  NA 138  K 4.3  CL 101  GLUCOSE 326*  BUN 12  CREATININE 1.00   GFR: Estimated Creatinine Clearance: 97.8 mL/min (by C-G formula based on SCr of 1 mg/dL). No results for input(s): PROCALCITON, WBC, LATICACIDVEN in the last 168 hours.  Liver Function Tests: No results for input(s): AST, ALT, ALKPHOS, BILITOT, PROT, ALBUMIN in the last 168 hours. No results for input(s): LIPASE, AMYLASE in the last 168 hours. No results for input(s): AMMONIA in the last 168 hours.  ABG    Component Value Date/Time   HCO3 19.0 (L) 01/01/2020 1145   TCO2 18 (L) 03-01-2020 0843   ACIDBASEDEF 5.0 (H) 01/01/2020 1145   O2SAT 98.0 01/01/2020 1145     Coagulation  Profile: No results for input(s): INR, PROTIME in the last 168 hours.  Cardiac Enzymes: No results for input(s): CKTOTAL, CKMB, CKMBINDEX, TROPONINI in the last 168 hours.  HbA1C: No results found for: HGBA1C  CBG: Recent Labs  Lab 03-01-2020 0834  GLUCAP 253*    Review of Systems:   Patient is encephalopathic and/or intubated. Therefore history has been obtained from chart review.   Past Medical History  He,  has a past medical history of Alcohol dependence ([place]).   Surgical History    Past Surgical History:  Procedure Laterality Date  . ANKLE SURGERY    . ORIF ELBOW FRACTURE Left 06/02/2019   Procedure: OPEN REDUCTION INTERNAL FIXATION (ORIF) ELBOW/OLECRANON FRACTURE;  Surgeon: Altamese Gray, MD;  Location: [facility];  Service: Orthopedics;  Laterality: Left;     Social History   reports that he has never smoked. He has quit using smokeless tobacco. He reports current alcohol use. He reports that he does not use drugs.   Family History   His family history is not on file.   Allergies No Known Allergies   Home Medications  Prior to Admission medications   Medication Sig Start Date End Date Taking? Authorizing Provider  Aspirin-Salicylamide-Caffeine (BC HEADACHE POWDER PO) Take 1 Package by mouth daily as needed (pain).    [provider]  folic acid (FOLVITE) 1 MG tablet Take 1 tablet (1 mg total) by mouth daily. Patient not taking: Reported on 01/01/2020 06/04/19   Geradine Girt, DO  hydrochlorothiazide (HYDRODIURIL) 12.5 MG tablet Take 12.5 mg by mouth daily.    [provider]  HYDROcodone-acetaminophen (NORCO/VICODIN) 5-325 MG tablet Take 1-2 tablets by mouth every 4 (four) hours as needed for moderate pain. Patient not taking: Reported on 01/01/2020 06/03/19   Geradine Girt, DO  ibuprofen (ADVIL) 200 MG tablet Take 200 mg by mouth every 6 (six) hours as needed for mild pain or moderate pain.    [provider]  levETIRAcetam (KEPPRA) 500 MG  tablet Take 1 tablet (500 mg total) by mouth 2 (two) times daily. 01/01/20   Jacqlyn Larsen, PA-C     Critical care time: 45 min due to cardiac arrest, seizure, acute hypoxemic encephalopathy      Georgann Housekeeper, AGACNP-BC [place] for personal pager PCCM on call pager [phone]  2020-03-01 9:27 AM

## 2020-02-05 NOTE — Procedures (Signed)
Central Venous Catheter Insertion Procedure Note Pedro Mitchell [id number] 1961-09-01  Procedure: Insertion of Central Venous Catheter Indications: Assessment of intravascular volume and Drug and/or fluid administration  Procedure Details Consent: Unable to obtain consent because of emergent medical necessity. Time Out: Verified patient identification, verified procedure, site/side was marked, verified correct patient position, special equipment/implants available, medications/allergies/relevent history reviewed, required imaging and test results available.  Performed  Maximum sterile technique was used including antiseptics, cap, gloves, gown, hand hygiene, mask and sheet. Skin prep: Chlorhexidine; local anesthetic administered A antimicrobial bonded/coated triple lumen catheter was placed in the right internal jugular vein using the Seldinger technique.  60F 20cm TLC inserted to 16cm under direct ultrasound guidance. Guidewire visualized in IJ. Position confirmed by present of injected contrast in RV.      Evaluation Blood flow good Complications: No apparent complications Patient did tolerate procedure well. Chest X-ray ordered to verify placement.  CXR: pending.  Pedro Mitchell 2020-02-06, 10:22 AM

## 2020-02-05 NOTE — Progress Notes (Signed)
Ventilator pt transported from Trauma A to 2 H 18 without any complications.

## 2020-02-05 NOTE — ED Notes (Signed)
X-ray at bedside. EDP on phone with patients mother. Reports patient drinking last night and had seizure like activity.

## 2020-02-05 NOTE — ED Provider Notes (Signed)
[facility] EMERGENCY DEPARTMENT Provider Note   CSN: [id number] Arrival date & time: 02/12/2020  0825     History Chief Complaint  Patient presents with  . Cardiac Arrest    Pedro Mitchell is a 59 y.o. male.  Presented to ER post cardiac arrest.  History obtained by EMS report, mother.  Limited due to acuity, unresponsive state.  Mother reports last night the patient was in his regular state of health, frequent heavy alcohol drinker, drinking lots of beer last night but no particularly unusual activity.  Was not complaining of any chest pain or difficulty in breathing.  This morning she heard a loud thud.  When she went to check on patient, appeared to be having a seizure with shaking in his upper and lower extremities.  Unresponsive.  The shaking stopped and patient remained unresponsive.  EMS states the initial fire first responders said he had week pulse, barely breathing, then lost pulses and started chest compressions.  EMS said their initial rhythm was PEA, gave 1 epi, total CPR time approximately 10 minutes.  No other meds given prior to arrival.  [facility] airway placed and functioning appropriately.  Chart review - recent ER visit for seizure, alcohol abuse  HPI     Past Medical History:  Diagnosis Date  . Alcohol dependence [facility])     Patient Active Problem List   Diagnosis Date Noted  . Cardiac arrest ([place]) 02/22/2020  . Seizure ([place])   . Acute hypoxemic respiratory failure ([place])   . Acute encephalopathy   . Elbow fracture 06/02/2019  . Elbow fracture, left, sequela 06/01/2019  . Alcohol dependence with uncomplicated withdrawal ([place]) 06/01/2019  . Hypokalemia 06/01/2019    Past Surgical History:  Procedure Laterality Date  . ANKLE SURGERY    . ORIF ELBOW FRACTURE Left 06/02/2019   Procedure: OPEN REDUCTION INTERNAL FIXATION (ORIF) ELBOW/OLECRANON FRACTURE;  Surgeon: Altamese Peletier, MD;  Location: [place];  Service: Orthopedics;  Laterality: Left;         No family history on file.  Social History   Tobacco Use  . Smoking status: Never Smoker  . Smokeless tobacco: Former Network engineer Use Topics  . Alcohol use: Yes    Comment: h/o dependence, has been through treatment but continues to drink  . Drug use: No    Home Medications Prior to Admission medications   Medication Sig Start Date End Date Taking? Authorizing Provider  Aspirin-Salicylamide-Caffeine (BC HEADACHE POWDER PO) Take 1 Package by mouth daily as needed (pain).    [provider]  folic acid (FOLVITE) 1 MG tablet Take 1 tablet (1 mg total) by mouth daily. Patient not taking: Reported on 01/01/2020 06/04/19   Geradine Girt, DO  hydrochlorothiazide (HYDRODIURIL) 12.5 MG tablet Take 12.5 mg by mouth daily.    [provider]  HYDROcodone-acetaminophen (NORCO/VICODIN) 5-325 MG tablet Take 1-2 tablets by mouth every 4 (four) hours as needed for moderate pain. Patient not taking: Reported on 01/01/2020 06/03/19   Geradine Girt, DO  ibuprofen (ADVIL) 200 MG tablet Take 200 mg by mouth every 6 (six) hours as needed for mild pain or moderate pain.    [provider]  levETIRAcetam (KEPPRA) 500 MG tablet Take 1 tablet (500 mg total) by mouth 2 (two) times daily. 01/01/20   Jacqlyn Larsen, PA-C    Allergies    Patient has no known allergies.  Review of Systems   Review of Systems  Unable to perform ROS:  Mental status change     Physical Exam Updated Vital Signs BP 101/75   Pulse (!) 114   Temp (!) 97 F (36.1 C) (Temporal)   Resp 20   Ht 6\' 5"  (1.956 m)   Wt 84.8 kg   SpO2 100%   BMI 22.17 kg/m   Physical Exam Vitals and nursing note reviewed.  Constitutional:      Appearance: He is well-developed.     Comments: Unresponsive, lying in bed  HENT:     Head: Normocephalic and atraumatic.     Mouth/Throat:     Comments: King airway in place Eyes:     Conjunctiva/sclera: Conjunctivae normal.     Pupils: Pupils are equal,  round, and reactive to light.  Cardiovascular:     Rate and Rhythm: Regular rhythm. Tachycardia present.     Pulses: Normal pulses.     Heart sounds: No murmur.  Pulmonary:     Comments: Bagging via King airway, breath sounds equal bilateral, good air movement  3 cm diameter ecchymosis over right anterior chest, appears old Abdominal:     Palpations: Abdomen is soft.     Tenderness: There is no abdominal tenderness.  Musculoskeletal:        General: No deformity or signs of injury.     Cervical back: Neck supple.  Skin:    General: Skin is warm and dry.  Neurological:     Comments: Unresponsive, no purposeful movement, occasional grunting noise, trying to breathe over the [facility] airway in somewhat disordered fashion, pupils 4 mm equal and reactive to light     ED Results / Procedures / Treatments   Labs (all labs ordered are listed, but only abnormal results are displayed) Labs Reviewed  CBC - Abnormal; Notable for the following components:      Result Value   WBC 15.6 (*)    RBC 4.19 (*)    MCV 108.6 (*)    nRBC 0.3 (*)    All other components within normal limits  LACTIC ACID, PLASMA - Abnormal; Notable for the following components:   Lactic Acid, Venous >11.0 (*)    All other components within normal limits  I-STAT CHEM 8, ED - Abnormal; Notable for the following components:   Glucose, Bld 326 (*)    Calcium, Ion 1.14 (*)    TCO2 18 (*)    All other components within normal limits  CBG MONITORING, ED - Abnormal; Notable for the following components:   Glucose-Capillary 253 (*)    All other components within normal limits  RESPIRATORY PANEL BY RT PCR (FLU A&B, COVID)  CULTURE, BLOOD (ROUTINE X 2)  CULTURE, BLOOD (ROUTINE X 2)  BASIC METABOLIC PANEL  MAGNESIUM  LACTIC ACID, PLASMA  LACTIC ACID, PLASMA  ETHANOL  RAPID URINE DRUG SCREEN, HOSP PERFORMED  BASIC METABOLIC PANEL  PROTIME-INR  PROTIME-INR  APTT  APTT  BLOOD GAS, ARTERIAL  HEMOGLOBIN A1C  TROPONIN I  (HIGH SENSITIVITY)    EKG EKG Interpretation  Date/Time:  Monday 02-19-20 08:59:21 EDT Ventricular Rate:  134 PR Interval:    QRS Duration: 89 QT Interval:  305 QTC Calculation: 456 R Axis:   65 Text Interpretation: Sinus tachycardia Borderline ST depression, diffuse leads Confirmed by [id number] (Marianna Fuss) on Feb 19, 2020 9:11:11 AM   Radiology DG Chest Portable 1 View  Result Date: 02/19/2020 CLINICAL DATA:  Status post intubation. Found unresponsive. EXAM: PORTABLE CHEST 1 VIEW COMPARISON:  None. FINDINGS: ET tube tip is just above the  level of the carina. The NG tube appears under advanced. The side port is above the level of the GE junction and should be advanced. Normal heart size. No pleural effusion or edema. No airspace densities. IMPRESSION: 1. The side port of the nasogastric tube is above the level of the GE junction and should be advanced. 2. No acute cardiopulmonary abnormalities. 3. ET tube tip is just above the level of the carina. Electronically Signed   By: Signa Kell M.D.   On: 02/08/2020 09:08    Procedures .Critical Care Performed by: Milagros Loll, MD Authorized by: Milagros Loll, MD   Critical care provider statement:    Critical care time (minutes):  446   Critical care was necessary to treat or prevent imminent or life-threatening deterioration of the following conditions:  CNS failure or compromise and respiratory failure   Critical care was time spent personally by me on the following activities:  Discussions with consultants, evaluation of patient's response to treatment, examination of patient, ordering and performing treatments and interventions, ordering and review of laboratory studies, ordering and review of radiographic studies, pulse oximetry, re-evaluation of patient's condition, obtaining history from patient or surrogate and review of old charts  Procedure Name: Intubation Date/Time: Feb 08, 2020 9:36 AM Performed by: Milagros Loll, MD Pre-anesthesia Checklist: Patient identified, Patient being monitored, Emergency Drugs available, Timeout performed and Suction available Preoxygenation: Pre-oxygenation with 100% oxygen (via King airway placed by EMS) Induction Type: Rapid sequence Laryngoscope Size: Glidescope and 4 Grade View: Grade I Tube size: 8.0 mm Number of attempts: 1 Airway Equipment and Method: Stylet and Video-laryngoscopy Placement Confirmation: ETT inserted through vocal cords under direct vision,  CO2 detector and Breath sounds checked- equal and bilateral Secured at: 26 cm Tube secured with: ETT holder Comments: King airway removed, good view of cords with video glidescope, noted significant vomitus in oropharynx but not obstructing airway, did not interfere with intubation, no immediate complications, atraumatic      (including critical care time)  Medications Ordered in ED Medications  rocuronium (ZEMURON) injection (100 mg Intravenous Given Feb 08, 2020 0832)  docusate (COLACE) 50 MG/5ML liquid 100 mg (100 mg Oral Not Given 02-08-2020 0903)  polyethylene glycol (MIRALAX / GLYCOLAX) packet 17 g (17 g Oral Not Given 02-08-2020 0903)  norepinephrine (LEVOPHED) 4mg  in premix infusion (has no administration in time range)  cisatracurium (NIMBEX) bolus via infusion 8.5 mg (has no administration in time range)    And  cisatracurium (NIMBEX) 200 mg in sodium chloride 0.9 % 200 mL (1 mg/mL) infusion (has no administration in time range)    And  cisatracurium (NIMBEX) bolus via infusion 4.2 mg (has no administration in time range)  artificial tears (LACRILUBE) ophthalmic ointment 1 application (has no administration in time range)  0.9 %  sodium chloride infusion (has no administration in time range)  fentaNYL (SUBLIMAZE) injection 100 mcg (has no administration in time range)  fentaNYL in NS (57mcg/ml) infusion-PREMIX (has no administration in time range)  fentaNYL (SUBLIMAZE) bolus via  infusion 50 mcg (has no administration in time range)  propofol (DIPRIVAN) 1000 MG/100ML infusion (has no administration in time range)  pantoprazole (PROTONIX) injection 40 mg (has no administration in time range)  insulin aspart (novoLOG) injection 0-15 Units (has no administration in time range)  levETIRAcetam (KEPPRA) IVPB 500 mg/100 mL premix (has no administration in time range)  thiamine (B-1) injection 100 mg (has no administration in time range)  folic acid injection 1 mg (  has no administration in time range)  fentaNYL (SUBLIMAZE) 100 MCG/2ML injection (100 mcg  Given 03/03/2020 0842)  levETIRAcetam (KEPPRA) IVPB 1000 mg/100 mL premix (1,000 mg Intravenous New Bag/Given 02/24/2020 9381)    ED Course  I have reviewed the triage vital signs and the nursing notes.  Pertinent labs & imaging results that were available during my care of the patient were reviewed by me and considered in my medical decision making (see chart for details).  Clinical Course as of Feb 04 938  Mon Feb 05, 2020  0830 At bedside on arrival, initial exam - unresponsive but breathing over [place], occasional grunting noise, no purposeful movement, then generalized upper and lower extremity shaking   [RD]  [phone] Proceeded with RSI, gave versed then roc, replaced king with ETT   [RD]  0856 Critical care at bedside   [RD]  0900 D/w cardiology in ER and critical care - critical care does not want formal cardiology consult at this time   [RD]  0904 Critical care to code cool   [RD]  0905 D/w mother - provided update, obtained additional hx, pos seizure   [RD]  0920 Rechecked, crit care at bedside   [RD]    Clinical Course User Index [RD] Milagros Loll, MD   MDM Rules/Calculators/A&P                      59 year old male presented to ER post arrest.  Per report of possible seizure episode, unresponsive state, first responders report initially had pulse then went pulseless, total CPR prior to arrival estimated to  be 10 minutes.  On arrival here, no purposeful movement except occasional grunting noise and with intermittently breathing over [facility] airway.  Soon after arriving here, patient had generalized upper and lower extremity shaking concerning for generalized seizure.  For seizure, induction, gave Versed. Prior to giving roc, seizure activity had stopped. For paralytic gave rocuronium.  Intubation atraumatic, good view with video glide scope, 1 attempt.  Noted significant vomitus in posterior oropharynx, no vomitus obstructing airway.  Started propofol drip, as needed fentanyl. Keppra for suspected seizure. Tachy, hypertensive, good pulse ox.  EKG without obvious ischemic changes.  Initial labs without significant abnormality. CT head, C-spine ordered to rule out intracranial etiology or trauma, no obvious trauma to head was noted. Suspect most likely seizure, alcohol abuse, pos aspiration event leading to initial arrest. Critical care promptly came to bedisde. Will be admitted to [facility].  They are planning to cool.  Final Clinical Impression(s) / ED Diagnoses Final diagnoses:  PEA (Pulseless electrical activity) (HCC)  Cardiac arrest (HCC)  Alcohol abuse  Observed seizure-like activity (HCC)    Rx / DC Orders ED Discharge Orders    None       Milagros Loll, MD 02/29/2020 [phone]

## 2020-02-05 NOTE — Progress Notes (Signed)
EEG complete - results pending 

## 2020-02-06 ENCOUNTER — Inpatient Hospital Stay (HOSPITAL_COMMUNITY): Payer: Self-pay

## 2020-02-06 ENCOUNTER — Other Ambulatory Visit: Payer: Self-pay

## 2020-02-06 LAB — CBC
HCT: 41.3 % (ref 39.0–52.0)
HCT: 39.8 % (ref 39.0–52.0)
Hemoglobin: 14.3 g/dL (ref 13.0–17.0)
Hemoglobin: 13.7 g/dL (ref 13.0–17.0)
MCH: 32.4 pg (ref 26.0–34.0)
MCH: 32.2 pg (ref 26.0–34.0)
MCHC: 34.6 g/dL (ref 30.0–36.0)
MCHC: 34.4 g/dL (ref 30.0–36.0)
MCV: 93.4 fL (ref 80.0–100.0)
MCV: 93.6 fL (ref 80.0–100.0)
Platelets: 162 10*3/uL (ref 150–400)
Platelets: 160 10*3/uL (ref 150–400)
RBC: 4.42 MIL/uL (ref 4.22–5.81)
RBC: 4.25 MIL/uL (ref 4.22–5.81)
RDW: 13 % (ref 11.5–15.5)
RDW: 13.2 % (ref 11.5–15.5)
WBC: 12.8 10*3/uL — ABNORMAL HIGH (ref 4.0–10.5)
WBC: 16.2 10*3/uL — ABNORMAL HIGH (ref 4.0–10.5)
nRBC: 0 % (ref 0.0–0.2)
nRBC: 0 % (ref 0.0–0.2)

## 2020-02-06 LAB — POCT I-STAT 7, (LYTES, BLD GAS, ICA,H+H)
Acid-Base Excess: 1 mmol/L (ref 0.0–2.0)
Acid-base deficit: 1 mmol/L (ref 0.0–2.0)
Bicarbonate: 21.4 mmol/L (ref 20.0–28.0)
Bicarbonate: 22.3 mmol/L (ref 20.0–28.0)
Calcium, Ion: 1.11 mmol/L — ABNORMAL LOW (ref 1.15–1.40)
Calcium, Ion: 1.14 mmol/L — ABNORMAL LOW (ref 1.15–1.40)
HCT: 45 % (ref 39.0–52.0)
HCT: 43 % (ref 39.0–52.0)
Hemoglobin: 15.3 g/dL (ref 13.0–17.0)
Hemoglobin: 14.6 g/dL (ref 13.0–17.0)
O2 Saturation: 100 %
O2 Saturation: 99 %
Patient temperature: 33
Patient temperature: 32.9
Potassium: 3.3 mmol/L — ABNORMAL LOW (ref 3.5–5.1)
Potassium: 3 mmol/L — ABNORMAL LOW (ref 3.5–5.1)
Sodium: 139 mmol/L (ref 135–145)
Sodium: 140 mmol/L (ref 135–145)
TCO2: 22 mmol/L (ref 22–32)
TCO2: 23 mmol/L (ref 22–32)
pCO2 arterial: 19.9 mmHg — CL (ref 32.0–48.0)
pCO2 arterial: 27.9 mmHg — ABNORMAL LOW (ref 32.0–48.0)
pH, Arterial: 7.627 (ref 7.350–7.450)
pH, Arterial: 7.493 — ABNORMAL HIGH (ref 7.350–7.450)
pO2, Arterial: 125 mmHg — ABNORMAL HIGH (ref 83.0–108.0)
pO2, Arterial: 102 mmHg (ref 83.0–108.0)

## 2020-02-06 LAB — POCT I-STAT, CHEM 8
BUN: 5 mg/dL — ABNORMAL LOW (ref 6–20)
BUN: 8 mg/dL (ref 6–20)
BUN: 9 mg/dL (ref 6–20)
BUN: 11 mg/dL (ref 6–20)
Calcium, Ion: 1.04 mmol/L — ABNORMAL LOW (ref 1.15–1.40)
Calcium, Ion: 1.09 mmol/L — ABNORMAL LOW (ref 1.15–1.40)
Calcium, Ion: 1.08 mmol/L — ABNORMAL LOW (ref 1.15–1.40)
Calcium, Ion: 1.06 mmol/L — ABNORMAL LOW (ref 1.15–1.40)
Chloride: 105 mmol/L (ref 98–111)
Chloride: 106 mmol/L (ref 98–111)
Chloride: 106 mmol/L (ref 98–111)
Chloride: 106 mmol/L (ref 98–111)
Creatinine, Ser: 0.2 mg/dL — ABNORMAL LOW (ref 0.61–1.24)
Creatinine, Ser: 0.4 mg/dL — ABNORMAL LOW (ref 0.61–1.24)
Creatinine, Ser: 0.4 mg/dL — ABNORMAL LOW (ref 0.61–1.24)
Creatinine, Ser: 0.4 mg/dL — ABNORMAL LOW (ref 0.61–1.24)
Glucose, Bld: 116 mg/dL — ABNORMAL HIGH (ref 70–99)
Glucose, Bld: 136 mg/dL — ABNORMAL HIGH (ref 70–99)
Glucose, Bld: 140 mg/dL — ABNORMAL HIGH (ref 70–99)
Glucose, Bld: 117 mg/dL — ABNORMAL HIGH (ref 70–99)
HCT: 43 % (ref 39.0–52.0)
HCT: 42 % (ref 39.0–52.0)
HCT: 40 % (ref 39.0–52.0)
HCT: 40 % (ref 39.0–52.0)
Hemoglobin: 14.6 g/dL (ref 13.0–17.0)
Hemoglobin: 14.3 g/dL (ref 13.0–17.0)
Hemoglobin: 13.6 g/dL (ref 13.0–17.0)
Hemoglobin: 13.6 g/dL (ref 13.0–17.0)
Potassium: 4.1 mmol/L (ref 3.5–5.1)
Potassium: 4.4 mmol/L (ref 3.5–5.1)
Potassium: 4.2 mmol/L (ref 3.5–5.1)
Potassium: 4.1 mmol/L (ref 3.5–5.1)
Sodium: 140 mmol/L (ref 135–145)
Sodium: 138 mmol/L (ref 135–145)
Sodium: 137 mmol/L (ref 135–145)
Sodium: 140 mmol/L (ref 135–145)
TCO2: 24 mmol/L (ref 22–32)
TCO2: 23 mmol/L (ref 22–32)
TCO2: 23 mmol/L (ref 22–32)
TCO2: 22 mmol/L (ref 22–32)

## 2020-02-06 LAB — BASIC METABOLIC PANEL
Anion gap: 14 (ref 5–15)
Anion gap: 14 (ref 5–15)
Anion gap: 10 (ref 5–15)
Anion gap: 10 (ref 5–15)
BUN: 6 mg/dL (ref 6–20)
BUN: 6 mg/dL (ref 6–20)
BUN: 6 mg/dL (ref 6–20)
BUN: 12 mg/dL (ref 6–20)
CO2: 21 mmol/L — ABNORMAL LOW (ref 22–32)
CO2: 21 mmol/L — ABNORMAL LOW (ref 22–32)
CO2: 21 mmol/L — ABNORMAL LOW (ref 22–32)
CO2: 20 mmol/L — ABNORMAL LOW (ref 22–32)
Calcium: 8.7 mg/dL — ABNORMAL LOW (ref 8.9–10.3)
Calcium: 8.7 mg/dL — ABNORMAL LOW (ref 8.9–10.3)
Calcium: 8.3 mg/dL — ABNORMAL LOW (ref 8.9–10.3)
Calcium: 7.8 mg/dL — ABNORMAL LOW (ref 8.9–10.3)
Chloride: 105 mmol/L (ref 98–111)
Chloride: 103 mmol/L (ref 98–111)
Chloride: 105 mmol/L (ref 98–111)
Chloride: 107 mmol/L (ref 98–111)
Creatinine, Ser: 0.52 mg/dL — ABNORMAL LOW (ref 0.61–1.24)
Creatinine, Ser: 0.34 mg/dL — ABNORMAL LOW (ref 0.61–1.24)
Creatinine, Ser: 0.41 mg/dL — ABNORMAL LOW (ref 0.61–1.24)
Creatinine, Ser: 0.61 mg/dL (ref 0.61–1.24)
GFR calc Af Amer: >60 mL/min (ref >60)
GFR calc Af Amer: >60 mL/min (ref >60)
GFR calc Af Amer: >60 mL/min (ref >60)
GFR calc Af Amer: >60 mL/min (ref >60)
GFR calc non Af Amer: >60 mL/min (ref >60)
GFR calc non Af Amer: >60 mL/min (ref >60)
GFR calc non Af Amer: >60 mL/min (ref >60)
GFR calc non Af Amer: >60 mL/min (ref >60)
Glucose, Bld: 102 mg/dL — ABNORMAL HIGH (ref 70–99)
Glucose, Bld: 115 mg/dL — ABNORMAL HIGH (ref 70–99)
Glucose, Bld: 114 mg/dL — ABNORMAL HIGH (ref 70–99)
Glucose, Bld: 119 mg/dL — ABNORMAL HIGH (ref 70–99)
Potassium: 3.3 mmol/L — ABNORMAL LOW (ref 3.5–5.1)
Potassium: 2.9 mmol/L — ABNORMAL LOW (ref 3.5–5.1)
Potassium: 3.5 mmol/L (ref 3.5–5.1)
Potassium: 4.2 mmol/L (ref 3.5–5.1)
Sodium: 140 mmol/L (ref 135–145)
Sodium: 138 mmol/L (ref 135–145)
Sodium: 136 mmol/L (ref 135–145)
Sodium: 137 mmol/L (ref 135–145)

## 2020-02-06 LAB — BLOOD CULTURE ID PANEL (REFLEXED)

## 2020-02-06 LAB — GLUCOSE, CAPILLARY
Glucose-Capillary: 102 mg/dL — ABNORMAL HIGH (ref 70–99)
Glucose-Capillary: 108 mg/dL — ABNORMAL HIGH (ref 70–99)
Glucose-Capillary: 106 mg/dL — ABNORMAL HIGH (ref 70–99)
Glucose-Capillary: 103 mg/dL — ABNORMAL HIGH (ref 70–99)
Glucose-Capillary: 110 mg/dL — ABNORMAL HIGH (ref 70–99)
Glucose-Capillary: 116 mg/dL — ABNORMAL HIGH (ref 70–99)
Glucose-Capillary: 110 mg/dL — ABNORMAL HIGH (ref 70–99)
Glucose-Capillary: 113 mg/dL — ABNORMAL HIGH (ref 70–99)
Glucose-Capillary: 122 mg/dL — ABNORMAL HIGH (ref 70–99)

## 2020-02-06 LAB — PHOSPHORUS
Phosphorus: 1.1 mg/dL — ABNORMAL LOW (ref 2.5–4.6)
Phosphorus: 2.7 mg/dL (ref 2.5–4.6)

## 2020-02-06 LAB — TRIGLYCERIDES: Triglycerides: 70 mg/dL (ref <150)

## 2020-02-06 LAB — MAGNESIUM: Magnesium: 2 mg/dL (ref 1.7–2.4)

## 2020-02-06 MED ORDER — MIDAZOLAM 50MG/50ML (1MG/ML) PREMIX INFUSION
0.0000 mg/h | INTRAVENOUS | Status: DC
  Administered 2020-02-06 – 2020-02-07 (×2): 5 mg/h via INTRAVENOUS
  Administered 2020-02-07: 10 mg/h via INTRAVENOUS
  Administered 2020-02-07: 5 mg/h via INTRAVENOUS
  Administered 2020-02-08 – 2020-02-09 (×4): 10 mg/h via INTRAVENOUS
  Administered 2020-02-09: 2 mg/h via INTRAVENOUS
  Filled 2020-02-06 (×9): qty 50

## 2020-02-06 MED ORDER — SODIUM PHOSPHATES 45 MMOLE/15ML IV SOLN
30.0000 mmol | Freq: Once | INTRAVENOUS | Status: AC
  Administered 2020-02-06: 30 mmol via INTRAVENOUS
  Filled 2020-02-06: qty 10

## 2020-02-06 MED ORDER — LEVETIRACETAM IN NACL 1000 MG/100ML IV SOLN
1000.0000 mg | Freq: Two times a day (BID) | INTRAVENOUS | Status: DC
  Administered 2020-02-06 – 2020-02-09 (×6): 1000 mg via INTRAVENOUS
  Filled 2020-02-06 (×6): qty 100

## 2020-02-06 MED ORDER — HEPARIN SODIUM (PORCINE) 5000 UNIT/ML IJ SOLN
5000.0000 [IU] | Freq: Three times a day (TID) | INTRAMUSCULAR | Status: DC
  Administered 2020-02-06 – 2020-02-09 (×10): 5000 [IU] via SUBCUTANEOUS
  Filled 2020-02-06 (×10): qty 1

## 2020-02-06 MED ORDER — MIDAZOLAM HCL 2 MG/2ML IJ SOLN
INTRAMUSCULAR | Status: AC
  Filled 2020-02-06: qty 6

## 2020-02-06 MED ORDER — NOREPINEPHRINE 16 MG/250ML-% IV SOLN
0.0000 ug/min | INTRAVENOUS | Status: DC
  Administered 2020-02-06: 28 ug/min via INTRAVENOUS
  Administered 2020-02-07: 18 ug/min via INTRAVENOUS
  Filled 2020-02-06 (×2): qty 250

## 2020-02-06 MED ORDER — POTASSIUM CHLORIDE 10 MEQ/50ML IV SOLN
10.0000 meq | INTRAVENOUS | Status: AC
  Administered 2020-02-06 (×6): 10 meq via INTRAVENOUS
  Filled 2020-02-06 (×5): qty 50

## 2020-02-06 MED ORDER — MIDAZOLAM HCL 2 MG/2ML IJ SOLN
1.0000 mg | Freq: Once | INTRAMUSCULAR | Status: AC
  Administered 2020-02-06: 1 mg via INTRAVENOUS

## 2020-02-06 MED ORDER — PRO-STAT SUGAR FREE PO LIQD
60.0000 mL | Freq: Three times a day (TID) | ORAL | Status: DC
  Administered 2020-02-06 – 2020-02-09 (×11): 60 mL
  Filled 2020-02-06 (×11): qty 60

## 2020-02-06 MED ORDER — ADULT MULTIVITAMIN W/MINERALS CH
1.0000 | ORAL_TABLET | Freq: Every day | ORAL | Status: DC
  Administered 2020-02-06 – 2020-02-09 (×4): 1 via ORAL
  Filled 2020-02-06 (×4): qty 1

## 2020-02-06 MED ORDER — VITAL HIGH PROTEIN PO LIQD
1000.0000 mL | ORAL | Status: DC
  Administered 2020-02-06 – 2020-02-08 (×3): 1000 mL

## 2020-02-06 MED ORDER — MIDAZOLAM HCL 2 MG/2ML IJ SOLN
4.0000 mg | Freq: Once | INTRAMUSCULAR | Status: AC
  Filled 2020-02-06: qty 4

## 2020-02-06 MED ORDER — MIDAZOLAM HCL 2 MG/2ML IJ SOLN
5.0000 mg | Freq: Once | INTRAMUSCULAR | Status: AC
  Administered 2020-02-06: 5 mg via INTRAVENOUS
  Filled 2020-02-06: qty 6

## 2020-02-06 MED ORDER — LEVETIRACETAM IN NACL 1000 MG/100ML IV SOLN
1000.0000 mg | Freq: Once | INTRAVENOUS | Status: AC
  Administered 2020-02-06: 1000 mg via INTRAVENOUS
  Filled 2020-02-06: qty 100

## 2020-02-06 MED ORDER — PHENYTOIN SODIUM 50 MG/ML IJ SOLN
100.0000 mg | Freq: Three times a day (TID) | INTRAMUSCULAR | Status: DC
  Administered 2020-02-06 – 2020-02-09 (×9): 100 mg via INTRAVENOUS
  Filled 2020-02-06 (×11): qty 2

## 2020-02-06 MED ORDER — SODIUM CHLORIDE 0.9 % IV SOLN
15.0000 mg/kg | Freq: Once | INTRAVENOUS | Status: AC
  Administered 2020-02-06: 1266 mg via INTRAVENOUS
  Filled 2020-02-06: qty 20

## 2020-02-06 MED ORDER — ALBUMIN HUMAN 5 % IV SOLN
12.5000 g | Freq: Once | INTRAVENOUS | Status: AC
  Administered 2020-02-06: 12.5 g via INTRAVENOUS
  Filled 2020-02-06: qty 250

## 2020-02-06 MED ORDER — MIDAZOLAM HCL 2 MG/2ML IJ SOLN
INTRAMUSCULAR | Status: AC
  Administered 2020-02-06: 16:00:00 4 mg via INTRAVENOUS
  Filled 2020-02-06: qty 6

## 2020-02-06 MED ORDER — POTASSIUM CHLORIDE 20 MEQ/15ML (10%) PO SOLN
20.0000 meq | ORAL | Status: AC
  Administered 2020-02-06 (×2): 20 meq
  Filled 2020-02-06 (×2): qty 15

## 2020-02-06 NOTE — Progress Notes (Signed)
LTM maint complete - no skin breakdown under: P3 O1 F7 A1 F3 Fp1

## 2020-02-06 NOTE — Progress Notes (Signed)
eLink Physician-Brief Progress Note Patient Name: Pedro Mitchell DOB: March 20, 1961 MRN: [id number]   Date of Service  02/06/2020  HPI/Events of Note  ABG on 50%/PRVC 18/TV 710/P 8 = 7.627/19.9/125.   eICU Interventions  Will order: 1. Decrease PRVC rate to 10. 2. Repeat ABG at 6 AM.      Intervention Category Major Interventions: Acid-Base disturbance - evaluation and management;Respiratory failure - evaluation and management  Forest Pruden Eugene 02/06/2020, 4:18 AM

## 2020-02-06 NOTE — Progress Notes (Signed)
PHARMACY - PHYSICIAN COMMUNICATION CRITICAL VALUE ALERT - BLOOD CULTURE IDENTIFICATION (BCID)  Pedro Mitchell is an 59 y.o. male who presented to [facility] on 2020/02/15 with a chief complaint of post CPR  Assessment:  1/2 BC with Staph species  Name of physician (or Provider) Contacted: Dr Arsenio Loader  Current antibiotics: none  Changes to prescribed antibiotics recommended:  None.  Doesn't appear to be septic.  Likely contaminant  Results for orders placed or performed during the hospital encounter of 2020-02-15  Blood Culture ID Panel (Reflexed) (Collected: Feb 15, 2020  9:57 AM)  Result Value Ref Range   Enterococcus species NOT DETECTED NOT DETECTED   Listeria monocytogenes NOT DETECTED NOT DETECTED   Staphylococcus species DETECTED (A) NOT DETECTED   Staphylococcus aureus (BCID) NOT DETECTED NOT DETECTED   Methicillin resistance NOT DETECTED NOT DETECTED   Streptococcus species NOT DETECTED NOT DETECTED   Streptococcus agalactiae NOT DETECTED NOT DETECTED   Streptococcus pneumoniae NOT DETECTED NOT DETECTED   Streptococcus pyogenes NOT DETECTED NOT DETECTED   Acinetobacter baumannii NOT DETECTED NOT DETECTED   Enterobacteriaceae species NOT DETECTED NOT DETECTED   Enterobacter cloacae complex NOT DETECTED NOT DETECTED   Escherichia coli NOT DETECTED NOT DETECTED   Klebsiella oxytoca NOT DETECTED NOT DETECTED   Klebsiella pneumoniae NOT DETECTED NOT DETECTED   Proteus species NOT DETECTED NOT DETECTED   Serratia marcescens NOT DETECTED NOT DETECTED   Haemophilus influenzae NOT DETECTED NOT DETECTED   Neisseria meningitidis NOT DETECTED NOT DETECTED   Pseudomonas aeruginosa NOT DETECTED NOT DETECTED   Candida albicans NOT DETECTED NOT DETECTED   Candida glabrata NOT DETECTED NOT DETECTED   Candida krusei NOT DETECTED NOT DETECTED   Candida parapsilosis NOT DETECTED NOT DETECTED   Candida tropicalis NOT DETECTED NOT DETECTED    Woodfin Ganja 02/06/2020  6:54 AM

## 2020-02-06 NOTE — Progress Notes (Signed)
[facility] ADULT ICU REPLACEMENT PROTOCOL FOR AM LAB REPLACEMENT ONLY  The patient does apply for the [facility] Adult ICU Electrolyte Replacment Protocol based on the criteria listed below:   1. Is GFR >/= 40 ml/min? Yes.    Patient's GFR today is >60 2. Is urine output >/= 0.5 ml/kg/hr for the last 6 hours? Yes.   Patient's UOP is 0.9 ml/kg/hr 3. Is BUN < 60 mg/dL? Yes.    Patient's BUN today is 6 4. Abnormal electrolyte(s): K+3.3 5. Ordered repletion with: Protocol per tube 6. If a panic level lab has been reported, has the CCM MD in charge been notified? Yes.  .   Physician:  Thresa Ross 02/06/2020 2:37 AM

## 2020-02-06 NOTE — Progress Notes (Addendum)
[facility] ADULT ICU REPLACEMENT PROTOCOL FOR AM LAB REPLACEMENT ONLY  The patient does apply for the [facility] Adult ICU Electrolyte Replacment Protocol based on the criteria listed below:   1. Is GFR >/= 40 ml/min? Yes.    Patient's GFR today is >60 2. Is urine output >/= 0.5 ml/kg/hr for the last 6 hours? Yes.   Patient's UOP is 1.2 ml/kg/hr 3. Is BUN < 60 mg/dL? Yes.    Patient's BUN today is 6 4. Abnormal electrolyte(s): K+ 2.9, Phosphorus 1.1  5. Ordered repletion with: Protocol central line 6. If a panic level lab has been reported, has the CCM MD in charge been notified? Yes.  .   Physician:  Thresa Ross 02/06/2020 6:38 AM

## 2020-02-06 NOTE — Procedures (Addendum)
Patient Name: Pedro Mitchell  MRN: [id number]  Epilepsy Attending: Charlsie Quest  Referring Physician/Provider: Joneen Roach, NP Duration: 03/02/2020 1522 to 02/06/2020 1522  Patient history: 59 year old male status post cardiac arrest on TTM.  EEG evaluate for seizures.  Level of alertness: Comatose  AEDs during EEG study: Propofol, Keppra  Technical aspects: This EEG study was done with scalp electrodes positioned according to the 10-20 International system of electrode placement. Electrical activity was acquired at a sampling rate of 500Hz  and reviewed with a high frequency filter of 70Hz  and a low frequency filter of 1Hz . EEG data were recorded continuously and digitally stored.   Description: EEG showed continuous generalized low amplitude polymorphic 3-6hz  theta-delta slowing. Intermittent generalized maximal bilateral posterior quadrant polyspikes were also seen, which lasted 2-10 seconds. Gradually after around 9am, eeg showed generalized (left > right) polyspikes initially lasting 2-3 seconds alternating with 2-3 seconds of background suppression which gradually increased to continuous generalized (L>R) polyspikes.Hyperventilation and photic stimulation were not performed.  ABNORMALITY - Polyspikes, generalized, Left>right - Continuous slow, generalized   IMPRESSION: This study showed evidence of generalized epileptogenicity as well as profound diffuse encephalopathy, non specific to etiology but could be secondary to anoxic/hypoxic brain injury, sedation. No definite seizures were seen throughout the recording.          Fender Herder 

## 2020-02-06 NOTE — Progress Notes (Signed)
eLink Physician-Brief Progress Note Patient Name: Pedro Mitchell DOB: [birth year] MRN: [id number]   Date of Service  02/06/2020  HPI/Events of Note  Contacted by Dr. Landis Gandy who relates that patient is having more seizure activity on continuous EEG. Neurology suggests Versed IV infusion.   eICU Interventions  Will order: 1. Versed 5 MG IV now. 2. Versed IV infusion to run at 5 mg/hour.      Intervention Category Major Interventions: Seizures - evaluation and management  Jenniger Figiel Eugene 02/06/2020, 9:02 PM

## 2020-02-06 NOTE — Progress Notes (Signed)
MD notified of sudden increase in BP. MD ordered 5 mg IVP Versed. SBP: 70's and re-notified CCMD, Levophed started per MD due to SBP of 50's-80's.  More orders given per MD and initiated. Will continue to monitor for changes.  Lynita Lombard, RN

## 2020-02-06 NOTE — Progress Notes (Signed)
Initial Nutrition Assessment  DOCUMENTATION CODES:   Not applicable  INTERVENTION:   Tube feeding:  -Vital High Protein @ 20 ml/hr via NGT -60 ml Prostat TID -MVI daily   Provides: 1080 kcals (1803 kcal with propofol), 132 grams protein, 401 ml free water.   NUTRITION DIAGNOSIS:   Increased nutrient needs related to acute illness as evidenced by estimated needs.  GOAL:   Patient will meet greater than or equal to 90% of their needs  MONITOR:   Diet advancement, Vent status, Skin, TF tolerance, Weight trends, Labs, I & O's  REASON FOR ASSESSMENT:   Ventilator    ASSESSMENT:   Patient with PMH significant for ETOH abuse. Presents this admission after cardiac arrest likely secondary to prolonged ETOH withdrawal seizure.   On TTM 33. Start rewarming at 1300. CT head shows prior [facility] and possible early anoxic injury. Start tube feeding per CCM.   Records indicate pt has maintained his weight over the last eight months. Utilize 84.4 kg as EDW.   Patient is currently intubated on ventilator support MV: 7.4 L/min Temp (24hrs), Avg:91.6 F (33.1 C), Min:90.7 F (32.6 C), Max:95.4 F (35.2 C)  Propofol: 27.4 ml/hr- provides 723 kcal from lipids daily   I/O: -2,104 ml since admit  UOP: 4,010 ml x 24 hrs   Drips: nimbex, propofol, sodium phosphate  Medications: colace, folic acid, SS novolog, levemir, miralax, thiamine Labs: Cr 0.20 CBG 103-116  Diet Order:   Diet Order    None      EDUCATION NEEDS:   Not appropriate for education at this time  Skin:  Skin Assessment: Reviewed RN Assessment  Last BM:  PTA  Height:   Ht Readings from Last 1 Encounters:  02/24/2020 6\' 5"  (1.956 m)    Weight:   Wt Readings from Last 1 Encounters:  02/18/2020 84.4 kg    BMI:  Body mass index is 22.06 kg/m.  Estimated Nutritional Needs:   Kcal:  1744 kcal  Protein:  125-145 grams  Fluid:  >/= 1.7 L/day   04/06/20 RD, LDN Clinical Nutrition Pager listed in  AMION

## 2020-02-06 NOTE — H&P (Signed)
NAME:  Pedro Mitchell, MRN:  [id number], DOB:  01/07/1961, LOS: 1 ADMISSION DATE:  02-27-20, CONSULTATION DATE:  5/3 REFERRING MD:  Dr. Stevie Kern, CHIEF COMPLAINT:  PEA arrest  Brief History   59 year old male admitted 5/3 after PEA arrest and possible seizure.   History of present illness   60 year old male with PMH as below, which is significant for ETOH abuse and recently seen in ED for single seizure discharged on Keppra. His mother reports he had been drinking in the PM hours of 5/2. In the AM hours of 5/3 she heard him collapse on the 2nd floor. She went up to find him shaking with seizure like movement. She called EMS and upon their arrival he had weak pulse, which he soon lost. He underwent immediate CPR for 10 mins until ROSC. EDP noted some seizure like movements. He was intubated in ED and PCCM was consulted.   Past Medical History   has a past medical history of Alcohol dependence (HCC).  Significant Hospital Events   5/3 Admit and started TTM 33  5/4 Rewarming.  Consults:    Procedures:  ETT 5/3 > CVL 5/3 >  Significant Diagnostic Tests:  CT head 5/3 > sequellae of prior SAH. Possible early anoxic injury.  EEG 5/3 > encephalopathic Echocardiogram 5/3 > mild LVH otherwise normal.  Micro Data:  MRSA negative MSSE in 2/4 blood cultures.   Antimicrobials:  None  Interim history/subjective:  Improved hypertension with increased sedation overnight.   Objective   Blood pressure 115/63, pulse (!) 49, temperature (!) 91.6 F (33.1 C), temperature source Bladder, resp. rate 10, height 6\' 5"  (1.956 m), weight 84.4 kg, SpO2 99 %. CVP:  [3 mmHg-8 mmHg] 8 mmHg  Vent Mode: PRVC FiO2 (%):  [40 %-100 %] 40 % Set Rate:  [10 bmp-18 bmp] 10 bmp Vt Set:  [710 mL] 710 mL PEEP:  [5 cmH20-8 cmH20] 8 cmH20 Plateau Pressure:  [13 cmH20-25 cmH20] 21 cmH20   Intake/Output Summary (Last 24 hours) at 02/06/2020 0802 Last data filed at 02/06/2020 0700 Gross per 24 hour  Intake  1655.98 ml  Output 4460 ml  Net -2804.02 ml   Filed Weights   02-27-2020 0845 02/27/2020 1200  Weight: 84.8 kg 84.4 kg    Examination: General: middle aged male on vent HENT: [place]/AT, Pupils sluggish, no JVD Lungs: Clear Cardiovascular: RRR, no MRG Abdomen: Soft, non-tender Extremities: no acute deformity or edema.  Neuro: no response to painful stimuli, no cranial reflexes on propofol and fentanyl.  Personally reviewed continuous EEG which showed generalized slowing in burst suppression pattern.   Resolved Hospital Problem list     Assessment & Plan:   Critically ill due to PEA Cardiac arrest requiring targeted temperature management. Etiology likely due to prolonged ethoh withdrawal seizure..   - commence warming at 1300 today as per protocol.  Acute hypoxemic respiratory failure: secondary to cardiac arrest - Full vent support. - SBT once rewarmed and off sedation.  Seizure: presented late March with single seizure and has minor workup with imaging at that time. Found to have acute SAH. He was neuro intact and discharged home on Keppra. Has also been some question is sz related to ETOH.  - Continue home Keppra - continuous EEG, at risk for seizures on rewarming.April   Hyperglycemia with no history of DM - CBG monitoring and SSI - BMP pending  Alcohol abuse - Thiamine, folic acid  MSSE positive blood cultures - Likely contaminant, hold  antibiotics.   Best practice:  Diet: initiate tube feeds.  Pain/Anxiety/Delirium protocol (if indicated): prop/fent per TTM protocol - sedation interruption once rewarmed.  DVT prophylaxis: Heparin tid.  GI prophylaxis: PPI Glucose control: SSI Mobility: BR Code Status: FULL Family Communication: Mother updated at length by EDP. No new info to give at this time.  Disposition: ICU  Labs   CBC: Recent Labs  Lab 2020/02/19 0842 02-19-20 0843 19-Feb-2020 1533 02/19/2020 1726 02/06/20 0343 02/06/20 0507 02/06/20 0619  WBC 15.6*  --   --    --   --  12.8*  --   HGB 13.7   < > 15.3 15.6 15.3 14.3 14.6  HCT 45.5   < > 45.0 46.0 45.0 41.3 43.0  MCV 108.6*  --   --   --   --  93.4  --   PLT 183  --   --   --   --  162  --    < > = values in this interval not displayed.    Basic Metabolic Panel: Recent Labs  Lab Feb 19, 2020 0842 02/19/20 0843 02-19-20 1009 Feb 19, 2020 1009 Feb 19, 2020 1533 Feb 19, 2020 1533 2020/02/19 1726 Feb 19, 2020 1726 2020-02-19 1819 Feb 19, 2020 2323 02/06/20 0343 02/06/20 0507 02/06/20 0619  NA 137   < > 138   < > 141   < > 139   < > 136 140 139 138 140  K 4.6   < > 4.6   < > 3.0*   < > 3.0*   < > 3.1* 3.3* 3.3* 2.9* 3.0*  CL 99   < > 103   < > 102  --  101  --  102 105  --  103  --   CO2 14*  --  17*  --   --   --   --   --  18* 21*  --  21*  --   GLUCOSE 342*   < > 238*   < > 302*  --  256*  --  217* 102*  --  115*  --   BUN 10   < > 10   < > 8  --  7  --  8 6  --  6  --   CREATININE 1.11   < > 0.88   < > <0.20*  --  0.20*  --  0.66 0.52*  --  0.34*  --   CALCIUM 8.6*  --  8.2*  --   --   --   --   --  8.6* 8.7*  --  8.7*  --   MG 2.4  --   --   --   --   --   --   --   --   --   --  2.0  --   PHOS  --   --   --   --   --   --   --   --   --   --   --  1.1*  --    < > = values in this interval not displayed.   GFR: Estimated Creatinine Clearance: 121.6 mL/min (A) (by C-G formula based on SCr of 0.34 mg/dL (L)). Recent Labs  Lab 2020/02/19 0842 19-Feb-2020 1009 02-19-20 1235 02/06/20 0507  WBC 15.6*  --   --  12.8*  LATICACIDVEN >11.0* 7.4* 3.3*  --     Liver Function Tests: No results for input(s): AST, ALT, ALKPHOS, BILITOT, PROT, ALBUMIN in the last 168  hours. No results for input(s): LIPASE, AMYLASE in the last 168 hours. No results for input(s): AMMONIA in the last 168 hours.  ABG    Component Value Date/Time   PHART 7.493 (H) 02/06/2020 0619   PCO2ART 27.9 (L) 02/06/2020 0619   PO2ART 102 02/06/2020 0619   HCO3 22.3 02/06/2020 0619   TCO2 23 02/06/2020 0619   ACIDBASEDEF 1.0 02/06/2020 0619    O2SAT 99.0 02/06/2020 0619     Coagulation Profile: Recent Labs  Lab 2020-02-14 1009 2020/02/14 1819  INR 1.1 1.1    Cardiac Enzymes: No results for input(s): CKTOTAL, CKMB, CKMBINDEX, TROPONINI in the last 168 hours.  HbA1C: Hgb A1c MFr Bld  Date/Time Value Ref Range Status  02/14/2020 10:09 AM 5.7 (H) 4.8 - 5.6 % Final    Comment:    (NOTE) Pre diabetes:          5.7%-6.4% Diabetes:              >6.4% Glycemic control for   <7.0% adults with diabetes     CBG: Recent Labs  Lab 02/14/2020 2129 02/14/2020 2321 02/06/20 0119 02/06/20 0325 02/06/20 0505  GLUCAP 120* 102* 102* 108* 106*   CRITICAL CARE Performed by: Kipp Brood   Total critical care time: 30 minutes  Critical care time was exclusive of separately billable procedures and treating other patients.  Critical care was necessary to treat or prevent imminent or life-threatening deterioration.  Critical care was time spent personally by me on the following activities: development of treatment plan with patient and/or surrogate as well as nursing, discussions with consultants, evaluation of patient's response to treatment, examination of patient, obtaining history from patient or surrogate, ordering and performing treatments and interventions, ordering and review of laboratory studies, ordering and review of radiographic studies, pulse oximetry, re-evaluation of patient's condition and participation in multidisciplinary rounds.  Kipp Brood, MD [facility] ICU Physician [place]  Pager: [phone] Mobile: [phone] After hours: [phone].   02/06/2020 8:02 AM

## 2020-02-07 LAB — POCT I-STAT, CHEM 8
BUN: 13 mg/dL (ref 6–20)
BUN: 13 mg/dL (ref 6–20)
BUN: 14 mg/dL (ref 6–20)
BUN: 14 mg/dL (ref 6–20)
Calcium, Ion: 1.02 mmol/L — ABNORMAL LOW (ref 1.15–1.40)
Calcium, Ion: 1.08 mmol/L — ABNORMAL LOW (ref 1.15–1.40)
Calcium, Ion: 1.11 mmol/L — ABNORMAL LOW (ref 1.15–1.40)
Calcium, Ion: 1.19 mmol/L (ref 1.15–1.40)
Chloride: 104 mmol/L (ref 98–111)
Chloride: 105 mmol/L (ref 98–111)
Chloride: 105 mmol/L (ref 98–111)
Chloride: 106 mmol/L (ref 98–111)
Creatinine, Ser: 0.5 mg/dL — ABNORMAL LOW (ref 0.61–1.24)
Creatinine, Ser: 0.6 mg/dL — ABNORMAL LOW (ref 0.61–1.24)
Creatinine, Ser: 0.6 mg/dL — ABNORMAL LOW (ref 0.61–1.24)
Creatinine, Ser: 0.6 mg/dL — ABNORMAL LOW (ref 0.61–1.24)
Glucose, Bld: 106 mg/dL — ABNORMAL HIGH (ref 70–99)
Glucose, Bld: 113 mg/dL — ABNORMAL HIGH (ref 70–99)
Glucose, Bld: 118 mg/dL — ABNORMAL HIGH (ref 70–99)
Glucose, Bld: 132 mg/dL — ABNORMAL HIGH (ref 70–99)
HCT: 39 % (ref 39.0–52.0)
HCT: 40 % (ref 39.0–52.0)
HCT: 40 % (ref 39.0–52.0)
HCT: 42 % (ref 39.0–52.0)
Hemoglobin: 13.3 g/dL (ref 13.0–17.0)
Hemoglobin: 13.6 g/dL (ref 13.0–17.0)
Hemoglobin: 13.6 g/dL (ref 13.0–17.0)
Hemoglobin: 14.3 g/dL (ref 13.0–17.0)
Potassium: 3.9 mmol/L (ref 3.5–5.1)
Potassium: 3.9 mmol/L (ref 3.5–5.1)
Potassium: 4.4 mmol/L (ref 3.5–5.1)
Potassium: 4.4 mmol/L (ref 3.5–5.1)
Sodium: 139 mmol/L (ref 135–145)
Sodium: 140 mmol/L (ref 135–145)
Sodium: 140 mmol/L (ref 135–145)
Sodium: 140 mmol/L (ref 135–145)
TCO2: 21 mmol/L — ABNORMAL LOW (ref 22–32)
TCO2: 21 mmol/L — ABNORMAL LOW (ref 22–32)
TCO2: 23 mmol/L (ref 22–32)
TCO2: 24 mmol/L (ref 22–32)

## 2020-02-07 LAB — BASIC METABOLIC PANEL
Anion gap: 13 (ref 5–15)
Anion gap: 7 (ref 5–15)
BUN: 15 mg/dL (ref 6–20)
BUN: 11 mg/dL (ref 6–20)
CO2: 18 mmol/L — ABNORMAL LOW (ref 22–32)
CO2: 23 mmol/L (ref 22–32)
Calcium: 7.8 mg/dL — ABNORMAL LOW (ref 8.9–10.3)
Calcium: 7.9 mg/dL — ABNORMAL LOW (ref 8.9–10.3)
Chloride: 105 mmol/L (ref 98–111)
Chloride: 108 mmol/L (ref 98–111)
Creatinine, Ser: 0.7 mg/dL (ref 0.61–1.24)
Creatinine, Ser: 0.67 mg/dL (ref 0.61–1.24)
GFR calc Af Amer: >60 mL/min (ref >60)
GFR calc Af Amer: >60 mL/min (ref >60)
GFR calc non Af Amer: >60 mL/min (ref >60)
GFR calc non Af Amer: >60 mL/min (ref >60)
Glucose, Bld: 109 mg/dL — ABNORMAL HIGH (ref 70–99)
Glucose, Bld: 109 mg/dL — ABNORMAL HIGH (ref 70–99)
Potassium: 4.3 mmol/L (ref 3.5–5.1)
Potassium: 4.2 mmol/L (ref 3.5–5.1)
Sodium: 136 mmol/L (ref 135–145)
Sodium: 138 mmol/L (ref 135–145)

## 2020-02-07 LAB — GLUCOSE, CAPILLARY
Glucose-Capillary: 104 mg/dL — ABNORMAL HIGH (ref 70–99)
Glucose-Capillary: 103 mg/dL — ABNORMAL HIGH (ref 70–99)
Glucose-Capillary: 80 mg/dL (ref 70–99)
Glucose-Capillary: 79 mg/dL (ref 70–99)
Glucose-Capillary: 107 mg/dL — ABNORMAL HIGH (ref 70–99)
Glucose-Capillary: 91 mg/dL (ref 70–99)

## 2020-02-07 LAB — POCT I-STAT 7, (LYTES, BLD GAS, ICA,H+H)
Acid-base deficit: 2 mmol/L (ref 0.0–2.0)
Bicarbonate: 20.2 mmol/L (ref 20.0–28.0)
Calcium, Ion: 1.06 mmol/L — ABNORMAL LOW (ref 1.15–1.40)
HCT: 41 % (ref 39.0–52.0)
Hemoglobin: 13.9 g/dL (ref 13.0–17.0)
O2 Saturation: 99 %
Patient temperature: 36.9
Potassium: 4 mmol/L (ref 3.5–5.1)
Sodium: 139 mmol/L (ref 135–145)
TCO2: 21 mmol/L — ABNORMAL LOW (ref 22–32)
pCO2 arterial: 26.9 mmHg — ABNORMAL LOW (ref 32.0–48.0)
pH, Arterial: 7.483 — ABNORMAL HIGH (ref 7.350–7.450)
pO2, Arterial: 127 mmHg — ABNORMAL HIGH (ref 83.0–108.0)

## 2020-02-07 LAB — TRIGLYCERIDES: Triglycerides: 212 mg/dL — ABNORMAL HIGH (ref <150)

## 2020-02-07 MED ORDER — ACETAMINOPHEN 325 MG PO TABS: 650.0000 mg | ORAL_TABLET | Freq: Four times a day (QID) | ORAL | Status: DC | PRN

## 2020-02-07 MED ORDER — ACETAMINOPHEN 325 MG PO TABS
650.0000 mg | ORAL_TABLET | Freq: Four times a day (QID) | ORAL | Status: DC | PRN
  Administered 2020-02-07 – 2020-02-08 (×3): 650 mg
  Filled 2020-02-07 (×3): qty 2

## 2020-02-07 MED ORDER — BACLOFEN 1 MG/ML ORAL SUSPENSION
10.0000 mg | Freq: Three times a day (TID) | ORAL | Status: DC
  Administered 2020-02-07 – 2020-02-09 (×6): 10 mg
  Filled 2020-02-07 (×8): qty 1

## 2020-02-07 MED ORDER — MIDAZOLAM HCL 2 MG/2ML IJ SOLN
5.0000 mg | Freq: Once | INTRAMUSCULAR | Status: AC
  Administered 2020-02-07: 5 mg via INTRAVENOUS

## 2020-02-07 MED ORDER — SODIUM CHLORIDE 0.9 % IV BOLUS
500.0000 mL | Freq: Once | INTRAVENOUS | Status: AC
  Administered 2020-02-07: 500 mL via INTRAVENOUS

## 2020-02-07 NOTE — Progress Notes (Signed)
RN entered patient's room at 1630 to see what appeared to be seizure-like activity. Red button pressed on EEG monitor. Patient with bilat upper extremity twitching, chest twitching, asynchrony with the vent, and elevated BP. Dr. Denese Killings at patient's bedside. Orders received for 5mg  versed bolus and to increase versed drip to 10mg /hr. MD stated it doesn't appear to be seizure activity on the EEG, but will continue to closely monitor.  , RN

## 2020-02-07 NOTE — Progress Notes (Signed)
Critical care attending progress note:  Called to bedside for abnormal movements and concern of seizure activity. Periodic jerking more consistent with hiccups.  Fine shivering despite relatively consistent water temperature.  EEG shows occasional GPD's but no epileptiform activity at this time.  Attempted to contact patient's mother but no answer.  Have increased Versed to control abnormal movements and started baclofen for potential hiccups.  He may require restarting fentanyl to control shivering.  CRITICAL CARE Performed by: Lynnell Catalan   Total critical care time: 30 additional minutes  Critical care time was exclusive of separately billable procedures and treating other patients.  Critical care was necessary to treat or prevent imminent or life-threatening deterioration.  Critical care was time spent personally by me on the following activities: development of treatment plan with patient and/or surrogate as well as nursing, discussions with consultants, evaluation of patient's response to treatment, examination of patient, obtaining history from patient or surrogate, ordering and performing treatments and interventions, ordering and review of laboratory studies, ordering and review of radiographic studies, pulse oximetry, re-evaluation of patient's condition and participation in multidisciplinary rounds.  Lynnell Catalan, MD [facility] ICU Physician [facility]  Pager: [phone] Mobile: [phone] After hours: [phone].

## 2020-02-07 NOTE — Progress Notes (Signed)
EEG maintenance complete. No skin breakdown at electrode site FP1 O1 F7 F3. Continue to monitor

## 2020-02-07 NOTE — Procedures (Signed)
Patient Name:Pedro Mitchell ITG:[id number] Epilepsy Attending:Javius Sylla Annabelle Harman Referring Physician/Provider:Paul Mikey Bussing, NP Duration: 02/06/2020 1522 to 02/07/2020 1522  Patient history:59 year old male status post cardiac arrest on TTM. EEG evaluate for seizures.  Level of alertness:Comatose  AEDs during EEG study:Propofol, versed, PHT, Keppra  Technical aspects: This EEG study was done with scalp electrodes positioned according to the 10-20 International system of electrode placement. Electrical activity was acquired at a sampling rate of 500Hz  and reviewed with a high frequency filter of 70Hz  and a low frequency filter of 1Hz . EEG data were recorded continuously and digitally stored.  Description:EEG initially showed generalized (left > right) polyspikes.patient was loaded with fosphenytoin after which EEG showed continuous generalized 3 to 5 Hz theta-delta slowing.  However after around 1800, EEG again started showing generalized (left more than right) polyspikes.  Patient was then started on Versed drip after which EEG again showed continuous generalized 3 to 5 Hz theta-delta slowing.  Gradually generalized polyspike started reemerging from background slowing.  Hyperventilation and photic stimulation were not performed.  Event button was pressed on 02/06/2020 at 1535, 1537, 1608, 1923 and on 02/07/2020 at 518 for intermittent upper body twitching.  Concomitant EEG continues to show generalized (left more than right) polyspikes without any change in frequency or any evolution.  ABNORMALITY - Polyspikes, generalized, Left>right - Continuous slow, generalized  IMPRESSION: This study showed evidence of generalized epileptogenicity as well as profound diffuse encephalopathy, non specific to etiology but could be secondary to anoxic/hypoxic brain injury, sedation. Event button was pressed multiple times as described above for intermittent twitching movements.  Concomitant EEG  continued to show epileptiform discharges without any change in frequency or any evolution.  However focal motor seizures may at times not be seen on scalp EEG.  Therefore these episodes could be focal motor seizures versus myoclonus.  Clinical correlation is recommended.

## 2020-02-07 NOTE — Plan of Care (Signed)

## 2020-02-07 NOTE — Progress Notes (Signed)
eLink Physician-Brief Progress Note Patient Name: Pedro Mitchell DOB: 1960-12-29 MRN: [id number]   Date of Service  02/07/2020  HPI/Events of Note  Oliguria - Bladder scan with 5 mL. CVP = 12.   eICU Interventions  Will bolus with 0.9 NaCl 500 mL IV over 30 minutes now.      Intervention Category Major Interventions: Other:  Lenell Antu 02/07/2020, 12:44 AM

## 2020-02-07 NOTE — Progress Notes (Signed)
Patient's water temp had started to drop while shivering has increased. Dr. Denese Killings aware. Previous versed bolus and drip increase (at 1640) has not been effective. Fentanyl drip restarted at 1717. PRN tylenol given at 1726. No bair huggers currently available; several warm blankets applied to patient.  Leanna Battles, RN

## 2020-02-07 NOTE — H&P (Signed)
NAME:  Pedro Mitchell, MRN:  [id number], DOB:  10/01/1961, LOS: 2 ADMISSION DATE:  02/25/2020, CONSULTATION DATE:  5/3 REFERRING MD:  Dr. Stevie Kern, CHIEF COMPLAINT:  PEA arrest  Brief History   59 year old male admitted 5/3 after PEA arrest and witnessed seizure.   History of present illness   59 year old male with PMH as below, which is significant for ETOH abuse and recently seen in ED for single seizure discharged on Keppra. His mother reports he had been drinking in the PM hours of 5/2. In the AM hours of 5/3 she heard him collapse on the 2nd floor. She went up to find him shaking with seizure like movement. She called EMS and upon their arrival he had weak pulse, which he soon lost. He underwent immediate CPR for 10 mins until ROSC. EDP noted some seizure like movements. He was intubated in ED and PCCM was consulted.   Past Medical History   has a past medical history of Alcohol dependence (HCC).  Significant Hospital Events   5/3 Admit and started TTM 33  5/4 Rewarming.  Consults:    Procedures:  ETT 5/3 > CVL 5/3 >  Significant Diagnostic Tests:  CT head 5/3 > sequellae of prior SAH. Possible early anoxic injury.  EEG 5/3 > encephalopathic Echocardiogram 5/3 > mild LVH otherwise normal.  Micro Data:  MRSA negative MSSE in 2/4 blood cultures.   Antimicrobials:  None  Interim history/subjective:  Increasing frequency of epileptiform activity particularly with rewarming. Required an increase propofol, addition of versed and increase AED's  Objective   Blood pressure 123/62, pulse 77, temperature 98.4 F (36.9 C), resp. rate 10, height 6\' 5"  (1.956 m), weight 86.1 kg, SpO2 100 %. CVP:  [6 mmHg-13 mmHg] 6 mmHg  Vent Mode: PRVC FiO2 (%):  [40 %] 40 % Set Rate:  [10 bmp] 10 bmp Vt Set:  [710 mL] 710 mL PEEP:  [8 cmH20] 8 cmH20 Plateau Pressure:  [16 cmH20-25 cmH20] 22 cmH20   Intake/Output Summary (Last 24 hours) at 02/07/2020 04/08/2020 Last data filed at 02/07/2020  0900 Gross per 24 hour  Intake 4079.97 ml  Output 1055 ml  Net 3024.97 ml   Filed Weights   02/19/2020 0845 02/20/2020 1200 02/07/20 0500  Weight: 84.8 kg 84.4 kg 86.1 kg    Examination: General: middle aged male on vent HENT: [place]/AT, Pupils sluggish, no JVD Lungs: Clear, not overbreathing ventilator Cardiovascular: RRR, no MRG Abdomen: Soft, non-tender Extremities: no acute deformity or edema.  Neuro: no response to painful stimuli, no cranial reflexes on propofol and versed.  Personally reviewed continuous EEG which showed generalized slowing in burst suppression pattern. Occasional epileptiform discharges, frequent G[employer]'s  Resolved Hospital Problem list     Assessment & Plan:   Critically ill due to PEA Cardiac arrest requiring targeted temperature management. Etiology likely due to prolonged ethoh withdrawal seizure..   - Maintaining 37C today  Acute hypoxemic respiratory failure: secondary to cardiac arrest - Full vent support. - SBT once rewarmed and off sedation.  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 Prior seizure disorder. - Maintain burst suppression today.  - Continue Keppra and Phenytoin - continuous EEG.   Hyperglycemia with no history of DM - CBG monitoring and SSI - BMP pending  Alcohol abuse - Thiamine, folic acid  MSSE positive blood cultures - Likely contaminant, hold antibiotics.   Best practice:  Diet:  tube feeds.  Pain/Anxiety/Delirium protocol (if indicated): Versed  and Propofol for seizure control. DVT prophylaxis: Heparin tid.  GI prophylaxis: PPI Glucose control: SSI Mobility: BR Code Status: FULL Family Communication: Mother updated at length by EDP. No new info to give at this time.  Disposition: ICU  Labs   CBC: Recent Labs  Lab 16-Feb-2020 0842 2020-02-16 9604 02/06/20 0507 02/06/20 5409 02/06/20 0825 02/06/20 1124 02/06/20 2359 02/07/20 0221 02/07/20 0421  02/07/20 0632 02/07/20 0747  WBC 15.6*  --  12.8*  --  16.2*  --   --   --   --   --   --   HGB 13.7   < > 14.3   < > 13.7   < > 14.3 13.6 13.9 13.3 13.6  HCT 45.5   < > 41.3   < > 39.8   < > 42.0 40.0 41.0 39.0 40.0  MCV 108.6*  --  93.4  --  93.6  --   --   --   --   --   --   PLT 183  --  162  --  160  --   --   --   --   --   --    < > = values in this interval not displayed.    Basic Metabolic Panel: Recent Labs  Lab Feb 16, 2020 8119 Feb 16, 2020 1478 02-16-2020 2323 02/06/20 0343 02/06/20 0507 02/06/20 2956 02/06/20 0825 02/06/20 1124 02/06/20 2148 02/06/20 2148 02/06/20 2359 02/06/20 2359 02/07/20 0221 02/07/20 0420 02/07/20 0421 02/07/20 0632 02/07/20 0747  NA 137   < > 140   < > 138   < > 136   < > 137   < > 140   < > 139 136 139 140 140  K 4.6   < > 3.3*   < > 2.9*   < > 3.5   < > 4.2   < > 4.4   < > 4.4 4.3 4.0 3.9 3.9  CL 99   < > 105  --  103   < > 105   < > 107   < > 105  --  106 105  --  105 104  CO2 14*   < > 21*  --  21*  --  21*  --  20*  --   --   --   --  18*  --   --   --   GLUCOSE 342*   < > 102*  --  115*   < > 114*   < > 119*   < > 132*  --  106* 109*  --  118* 113*  BUN 10   < > 6  --  6   < > 6   < > 12   < > 13  --  13 15  --  14 14  CREATININE 1.11   < > 0.52*  --  0.34*   < > 0.41*   < > 0.61   < > 0.50*  --  0.60* 0.70  --  0.60* 0.60*  CALCIUM 8.6*   < > 8.7*  --  8.7*  --  8.3*  --  7.8*  --   --   --   --  7.8*  --   --   --   MG 2.4  --   --   --  2.0  --   --   --   --   --   --   --   --   --   --   --   --  PHOS  --   --   --   --  1.1*  --   --   --  2.7  --   --   --   --   --   --   --   --    < > = values in this interval not displayed.   GFR: Estimated Creatinine Clearance: 124.1 mL/min (A) (by C-G formula based on SCr of 0.6 mg/dL (L)). Recent Labs  Lab 02/12/2020 0842 02/28/2020 1009 02/06/2020 1235 02/06/20 0507 02/06/20 0825  WBC 15.6*  --   --  12.8* 16.2*  LATICACIDVEN >11.0* 7.4* 3.3*  --   --     Liver Function Tests: No  results for input(s): AST, ALT, ALKPHOS, BILITOT, PROT, ALBUMIN in the last 168 hours. No results for input(s): LIPASE, AMYLASE in the last 168 hours. No results for input(s): AMMONIA in the last 168 hours.  ABG    Component Value Date/Time   PHART 7.483 (H) 02/07/2020 0421   PCO2ART 26.9 (L) 02/07/2020 0421   PO2ART 127 (H) 02/07/2020 0421   HCO3 20.2 02/07/2020 0421   TCO2 24 02/07/2020 0747   ACIDBASEDEF 2.0 02/07/2020 0421   O2SAT 99.0 02/07/2020 0421     Coagulation Profile: Recent Labs  Lab 02/11/2020 1009 03/02/2020 1819  INR 1.1 1.1    Cardiac Enzymes: No results for input(s): CKTOTAL, CKMB, CKMBINDEX, TROPONINI in the last 168 hours.  HbA1C: Hgb A1c MFr Bld  Date/Time Value Ref Range Status  02/17/2020 10:09 AM 5.7 (H) 4.8 - 5.6 % Final    Comment:    (NOTE) Pre diabetes:          5.7%-6.4% Diabetes:              >6.4% Glycemic control for   <7.0% adults with diabetes     CBG: Recent Labs  Lab 02/06/20 1122 02/06/20 1627 02/06/20 2004 02/07/20 0419 02/07/20 0744  GLUCAP 110* 113* 122* 104* 103*   CRITICAL CARE Performed by: Lynnell Catalan   Total critical care time: 35 minutes  Critical care time was exclusive of separately billable procedures and treating other patients.  Critical care was necessary to treat or prevent imminent or life-threatening deterioration.  Critical care was time spent personally by me on the following activities: development of treatment plan with patient and/or surrogate as well as nursing, discussions with consultants, evaluation of patient's response to treatment, examination of patient, obtaining history from patient or surrogate, ordering and performing treatments and interventions, ordering and review of laboratory studies, ordering and review of radiographic studies, pulse oximetry, re-evaluation of patient's condition and participation in multidisciplinary rounds.  Lynnell Catalan, MD [facility] ICU Physician [facility] [place]  Critical Care  Pager: [phone] Mobile: [phone] After hours: [phone].   02/07/2020 9:37 AM

## 2020-02-08 LAB — GLUCOSE, CAPILLARY
Glucose-Capillary: 103 mg/dL — ABNORMAL HIGH (ref 70–99)
Glucose-Capillary: 103 mg/dL — ABNORMAL HIGH (ref 70–99)
Glucose-Capillary: 110 mg/dL — ABNORMAL HIGH (ref 70–99)
Glucose-Capillary: 134 mg/dL — ABNORMAL HIGH (ref 70–99)
Glucose-Capillary: 58 mg/dL — ABNORMAL LOW (ref 70–99)
Glucose-Capillary: 62 mg/dL — ABNORMAL LOW (ref 70–99)
Glucose-Capillary: 66 mg/dL — ABNORMAL LOW (ref 70–99)
Glucose-Capillary: 67 mg/dL — ABNORMAL LOW (ref 70–99)
Glucose-Capillary: 86 mg/dL (ref 70–99)
Glucose-Capillary: 97 mg/dL (ref 70–99)

## 2020-02-08 LAB — PHENYTOIN LEVEL, TOTAL: Phenytoin Lvl: 15.4 ug/mL (ref 10.0–20.0)

## 2020-02-08 LAB — TRIGLYCERIDES: Triglycerides: 132 mg/dL (ref <150)

## 2020-02-08 LAB — ALBUMIN: Albumin: 3 g/dL — ABNORMAL LOW (ref 3.5–5.0)

## 2020-02-08 MED ORDER — DEXTROSE 50 % IV SOLN
12.5000 g | INTRAVENOUS | Status: AC
  Administered 2020-02-08: 12.5 g via INTRAVENOUS

## 2020-02-08 MED ORDER — ACETAMINOPHEN 160 MG/5ML PO SOLN
650.0000 mg | Freq: Four times a day (QID) | ORAL | Status: DC | PRN
  Administered 2020-02-09 (×2): 650 mg
  Filled 2020-02-08 (×2): qty 20.3

## 2020-02-08 MED ORDER — NOREPINEPHRINE 16 MG/250ML-% IV SOLN
0.0000 ug/min | INTRAVENOUS | Status: DC
  Administered 2020-02-09: 2 ug/min via INTRAVENOUS
  Filled 2020-02-08: qty 250

## 2020-02-08 MED ORDER — IBUPROFEN 200 MG PO TABS
400.0000 mg | ORAL_TABLET | ORAL | Status: DC | PRN
  Administered 2020-02-08 – 2020-02-09 (×2): 400 mg
  Filled 2020-02-08 (×2): qty 2

## 2020-02-08 MED ORDER — METOCLOPRAMIDE HCL 5 MG/ML IJ SOLN: 10.0000 mg | Freq: Three times a day (TID) | INTRAMUSCULAR | Status: DC

## 2020-02-08 MED ORDER — DEXTROSE 50 % IV SOLN
INTRAVENOUS | Status: AC
  Filled 2020-02-08: qty 50

## 2020-02-08 MED ORDER — IBUPROFEN 200 MG PO TABS: 400.0000 mg | ORAL_TABLET | ORAL | Status: DC | PRN

## 2020-02-08 NOTE — Plan of Care (Signed)

## 2020-02-08 NOTE — Progress Notes (Signed)
NAME:  Pedro Mitchell, MRN:  [id number], DOB:  03/08/1961, LOS: 3 ADMISSION DATE:  Feb 09, 2020, CONSULTATION DATE:  5/3 REFERRING MD:  Dr. Roslynn Amble, CHIEF COMPLAINT:  PEA arrest  Brief History   59 year old male admitted 5/3 after PEA arrest and witnessed seizure.   History of present illness   59 year old male with PMH as below, which is significant for ETOH abuse and recently seen in ED for single seizure discharged on Keppra. His mother reports he had been drinking in the PM hours of 5/2. In the AM hours of 5/3 she heard him collapse on the 2nd floor. She went up to find him shaking with seizure like movement. She called EMS and upon their arrival he had weak pulse, which he soon lost. He underwent immediate CPR for 10 mins until ROSC. EDP noted some seizure like movements. He was intubated in ED and PCCM was consulted.   Past Medical History   has a past medical history of Alcohol dependence ([place]).  Significant Hospital Events   5/3 Admit and started TTM 34  5/4 Rewarming. 5/4-5/5 Increased epileptiform activity - sedation continued and anticonvulsants increased.   Consults:    Procedures:  ETT 5/3 > CVL 5/3 >  Significant Diagnostic Tests:  CT head 5/3 > sequellae of prior SAH. Possible early anoxic injury.  EEG 5/3 > encephalopathic Echocardiogram 5/3 > mild LVH otherwise normal.  Micro Data:  MRSA negative MSSE in 2/4 blood cultures.   Antimicrobials:  None  Interim history/subjective:  Increasing frequency of epileptiform activity particularly with rewarming. Required an increase propofol, addition of versed and increase AED's  Objective   Blood pressure 116/76, pulse (!) 103, temperature 98.6 F (37 C), resp. rate 10, height 6\' 5"  (1.956 m), weight 87.8 kg, SpO2 98 %. CVP:  [2 mmHg-18 mmHg] 4 mmHg  Vent Mode: PRVC FiO2 (%):  [40 %] 40 % Set Rate:  [10 bmp] 10 bmp Vt Set:  [710 mL] 710 mL PEEP:  [8 cmH20] 8 cmH20 Plateau Pressure:  [21 cmH20-25 cmH20] 25 cmH20    Intake/Output Summary (Last 24 hours) at 02/08/2020 0805 Last data filed at 02/08/2020 0800 Gross per 24 hour  Intake 2226.93 ml  Output 2615 ml  Net -388.07 ml   Filed Weights   02/22/2020 1200 02/07/20 0500 02/08/20 0500  Weight: 84.4 kg 86.1 kg 87.8 kg    Examination: General: middle aged male on vent HENT: [place]/AT, Pupils sluggish, no JVD Lungs: Clear, not overbreathing ventilator Cardiovascular: RRR, no MRG Abdomen: Soft, non-tender Extremities: no acute deformity or edema.  Neuro: no response to painful stimuli, no cranial reflexes on propofol and versed.  Personally reviewed continuous EEG which showed generalized slowingepileptiform discharges, occasional GPD's  Resolved Hospital Problem list     Assessment & Plan:   Critically ill due to PEA Cardiac arrest requiring targeted temperature management. Etiology likely due to prolonged ethoh withdrawal seizure..   - Maintaining 37C today  Acute hypoxemic respiratory failure: secondary to cardiac arrest - Full vent support. - SBT once rewarmed and off sedation.  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 Prior seizure disorder. - Wean sedation today. - Continue Keppra and Phenytoin - continuous EEG.   Alcohol abuse - Thiamine, folic acid  MSSE positive blood cultures - Likely contaminant, hold antibiotics.   Best practice:  Diet:  tube feeds.  Pain/Anxiety/Delirium protocol (if indicated): Versed and Propofol for seizure control. DVT prophylaxis: Heparin tid.  GI  prophylaxis: PPI Glucose control: SSI - stopping Levemir due to hypoglycemia Mobility: BR Code Status: FULL Family Communication:Updated sister at the bedside and explained long-term prognosis.  Disposition: ICU  Labs   CBC: Recent Labs  Lab 02/19/2020 0842 02/15/2020 4854 02/06/20 0507 02/06/20 6270 02/06/20 0825 02/06/20 1124 02/06/20 2359 02/07/20 0221 02/07/20 0421 02/07/20 0632  02/07/20 0747  WBC 15.6*  --  12.8*  --  16.2*  --   --   --   --   --   --   HGB 13.7   < > 14.3   < > 13.7   < > 14.3 13.6 13.9 13.3 13.6  HCT 45.5   < > 41.3   < > 39.8   < > 42.0 40.0 41.0 39.0 40.0  MCV 108.6*  --  93.4  --  93.6  --   --   --   --   --   --   PLT 183  --  162  --  160  --   --   --   --   --   --    < > = values in this interval not displayed.    Basic Metabolic Panel: Recent Labs  Lab 02/29/2020 0842 02/08/2020 3500 02/06/20 0507 02/06/20 9381 02/06/20 0825 02/06/20 1124 02/06/20 2148 02/06/20 2359 02/07/20 0221 02/07/20 0221 02/07/20 0420 02/07/20 0421 02/07/20 8299 02/07/20 0747 02/07/20 1011  NA 137   < > 138   < > 136   < > 137   < > 139   < > 136 139 140 140 138  K 4.6   < > 2.9*   < > 3.5   < > 4.2   < > 4.4   < > 4.3 4.0 3.9 3.9 4.2  CL 99   < > 103   < > 105   < > 107   < > 106  --  105  --  105 104 108  CO2 14*   < > 21*  --  21*  --  20*  --   --   --  18*  --   --   --  23  GLUCOSE 342*   < > 115*   < > 114*   < > 119*   < > 106*  --  109*  --  118* 113* 109*  BUN 10   < > 6   < > 6   < > 12   < > 13  --  15  --  14 14 11   CREATININE 1.11   < > 0.34*   < > 0.41*   < > 0.61   < > 0.60*  --  0.70  --  0.60* 0.60* 0.67  CALCIUM 8.6*   < > 8.7*  --  8.3*  --  7.8*  --   --   --  7.8*  --   --   --  7.9*  MG 2.4  --  2.0  --   --   --   --   --   --   --   --   --   --   --   --   PHOS  --   --  1.1*  --   --   --  2.7  --   --   --   --   --   --   --   --    < > =  values in this interval not displayed.   GFR: Estimated Creatinine Clearance: 126.5 mL/min (by C-G formula based on SCr of 0.67 mg/dL). Recent Labs  Lab 2020-02-27 0842 02/27/2020 1009 Feb 27, 2020 1235 02/06/20 0507 02/06/20 0825  WBC 15.6*  --   --  12.8* 16.2*  LATICACIDVEN >11.0* 7.4* 3.3*  --   --     Liver Function Tests: Recent Labs  Lab 02/08/20 0553  ALBUMIN 3.0*   No results for input(s): LIPASE, AMYLASE in the last 168 hours. No results for input(s): AMMONIA in the  last 168 hours.  ABG    Component Value Date/Time   PHART 7.483 (H) 02/07/2020 0421   PCO2ART 26.9 (L) 02/07/2020 0421   PO2ART 127 (H) 02/07/2020 0421   HCO3 20.2 02/07/2020 0421   TCO2 24 02/07/2020 0747   ACIDBASEDEF 2.0 02/07/2020 0421   O2SAT 99.0 02/07/2020 0421     Coagulation Profile: Recent Labs  Lab 2020-02-27 1009 2020/02/27 1819  INR 1.1 1.1    Cardiac Enzymes: No results for input(s): CKTOTAL, CKMB, CKMBINDEX, TROPONINI in the last 168 hours.  HbA1C: Hgb A1c MFr Bld  Date/Time Value Ref Range Status  27-Feb-2020 10:09 AM 5.7 (H) 4.8 - 5.6 % Final    Comment:    (NOTE) Pre diabetes:          5.7%-6.4% Diabetes:              >6.4% Glycemic control for   <7.0% adults with diabetes     CBG: Recent Labs  Lab 02/08/20 0001 02/08/20 0526 02/08/20 0530 02/08/20 0557 02/08/20 0619  GLUCAP 103* 62* 67* 66* 110*   CRITICAL CARE Performed by: Lynnell Catalan   Total critical care time: 35 minutes  Critical care time was exclusive of separately billable procedures and treating other patients.  Critical care was necessary to treat or prevent imminent or life-threatening deterioration.  Critical care was time spent personally by me on the following activities: development of treatment plan with patient and/or surrogate as well as nursing, discussions with consultants, evaluation of patient's response to treatment, examination of patient, obtaining history from patient or surrogate, ordering and performing treatments and interventions, ordering and review of laboratory studies, ordering and review of radiographic studies, pulse oximetry, re-evaluation of patient's condition and participation in multidisciplinary rounds.  Lynnell Catalan, MD [facility] ICU Physician [facility] [place] Critical Care  Pager: [phone] Mobile: [phone] After hours: [phone].   02/08/2020 8:05 AM

## 2020-02-08 NOTE — Progress Notes (Signed)
Hypoglycemic Event  CBG: 67  Treatment: 4 oz juice/soda and D50 25 mL (12.5 gm)  Symptoms: None  Follow-up CBG: Time:5:52 CBG Result:66  Possible Reasons for Event: Inadequate meal intake and Unknown  Comments/MD notified: Initially gave 4oz of grape juice via OGT. Then gave D50, which brought BS to 110     Pedro Mitchell S Delsa Walder

## 2020-02-08 NOTE — Progress Notes (Signed)
Patient with intermittent episodes of bilateral upper extremity/head twitching. Water temp 27-30 on arctic sun. Questionable seizures vs shivering. Tylenol given, bair hugger in place. Too much artifact noted on EEG to determine if it's seizures. Dr. Denese Killings at bedside to evaluate. He believes it's shivering or myoclonus and would like RN to continue to wean versed per orders.  Leanna Battles, RN

## 2020-02-08 NOTE — Procedures (Addendum)
Patient Name:Kartier JONG RICKMAN WEX:[id number] Epilepsy Attending:Sheri Prows Annabelle Harman Referring Physician/Provider:Paul Mikey Bussing, NP Duration:02/07/2020 1522to5/6/[id number]  Patient history:59 year old male status post cardiac arrest on TTM. EEG evaluate for seizures.  Level of alertness:Comatose  AEDs during EEG study:Propofol,versed, PHT, Keppra  Technical aspects: This EEG study was done with scalp electrodes positioned according to the 10-20 International system of electrode placement. Electrical activity was acquired at a sampling rate of 500Hz  and reviewed with a high frequency filter of 70Hz  and a low frequency filter of 1Hz . EEG data were recorded continuously and digitally stored.  Description:EEG initially showed continuous 3 to 5 Hz theta-delta slowing. Gradually intermittent generalized polyspike started reemerging from background slowing. Hyperventilation and photic stimulation were not performed.  Event button was pressed on 02/07/2020 at 1639 for intermittent arm twitching. Concomitant EEG continues to show generalized polyspikes without any change in frequency or any evolution.   ABNORMALITY - Polyspikes, generalized -Continuous slow, generalized  IMPRESSION: This studyshowed evidence of generalized epileptogenicity as well asprofound diffuse encephalopathy, non specific to etiology but could be secondary to anoxic/hypoxic brain injury, sedation. Event button was pressed as described above for intermittent twitching movements.  Concomitant EEG continued to show epileptiform discharges without any change in frequency or any evolution.  However, focal motor seizures may not be seen on scalp EEG.  Therefore clinical correlation is recommended.    EEG appears to be improving compared to previous day.   Oni Dietzman 

## 2020-02-08 NOTE — Progress Notes (Signed)
vLTM maintenance  No skin breakdown noted at sites:  FP1  FP2  F8  F7

## 2020-02-09 ENCOUNTER — Inpatient Hospital Stay (HOSPITAL_COMMUNITY): Payer: Self-pay

## 2020-02-09 LAB — CBC WITH DIFFERENTIAL/PLATELET
Abs Immature Granulocytes: 0.3 10*3/uL — ABNORMAL HIGH (ref 0.00–0.07)
Band Neutrophils: 13 %
Basophils Absolute: 0 10*3/uL (ref 0.0–0.1)
Basophils Relative: 0 %
Eosinophils Absolute: 0 10*3/uL (ref 0.0–0.5)
Eosinophils Relative: 0 %
HCT: 35.6 % — ABNORMAL LOW (ref 39.0–52.0)
Hemoglobin: 11.4 g/dL — ABNORMAL LOW (ref 13.0–17.0)
Lymphocytes Relative: 4 %
Lymphs Abs: 1 10*3/uL (ref 0.7–4.0)
MCH: 31.6 pg (ref 26.0–34.0)
MCHC: 32 g/dL (ref 30.0–36.0)
MCV: 98.6 fL (ref 80.0–100.0)
Metamyelocytes Relative: 1 %
Monocytes Absolute: 2 10*3/uL — ABNORMAL HIGH (ref 0.1–1.0)
Monocytes Relative: 8 %
Neutro Abs: 21.8 10*3/uL — ABNORMAL HIGH (ref 1.7–7.7)
Neutrophils Relative %: 74 %
Platelets: 144 10*3/uL — ABNORMAL LOW (ref 150–400)
RBC: 3.61 MIL/uL — ABNORMAL LOW (ref 4.22–5.81)
RDW: 14.8 % (ref 11.5–15.5)
Smear Review: ADEQUATE
WBC: 25 10*3/uL — ABNORMAL HIGH (ref 4.0–10.5)
nRBC: 0 % (ref 0.0–0.2)

## 2020-02-09 LAB — BASIC METABOLIC PANEL
Anion gap: 12 (ref 5–15)
BUN: 19 mg/dL (ref 6–20)
CO2: 25 mmol/L (ref 22–32)
Calcium: 8.7 mg/dL — ABNORMAL LOW (ref 8.9–10.3)
Chloride: 102 mmol/L (ref 98–111)
Creatinine, Ser: 0.66 mg/dL (ref 0.61–1.24)
GFR calc Af Amer: >60 mL/min (ref >60)
GFR calc non Af Amer: >60 mL/min (ref >60)
Glucose, Bld: 104 mg/dL — ABNORMAL HIGH (ref 70–99)
Potassium: 2.8 mmol/L — ABNORMAL LOW (ref 3.5–5.1)
Sodium: 139 mmol/L (ref 135–145)

## 2020-02-09 LAB — CULTURE, BLOOD (ROUTINE X 2)
Special Requests: ADEQUATE
Special Requests: ADEQUATE

## 2020-02-09 LAB — GLUCOSE, CAPILLARY
Glucose-Capillary: 105 mg/dL — ABNORMAL HIGH (ref 70–99)
Glucose-Capillary: 107 mg/dL — ABNORMAL HIGH (ref 70–99)
Glucose-Capillary: 115 mg/dL — ABNORMAL HIGH (ref 70–99)
Glucose-Capillary: 89 mg/dL (ref 70–99)
Glucose-Capillary: 98 mg/dL (ref 70–99)

## 2020-02-09 LAB — PROCALCITONIN: Procalcitonin: 1.22 ng/mL

## 2020-02-09 MED ORDER — ACETAMINOPHEN 650 MG RE SUPP: 650.0000 mg | Freq: Four times a day (QID) | RECTAL | Status: DC | PRN

## 2020-02-09 MED ORDER — GLYCOPYRROLATE 0.2 MG/ML IJ SOLN: 0.2000 mg | INTRAMUSCULAR | Status: DC | PRN

## 2020-02-09 MED ORDER — ACETAMINOPHEN 325 MG PO TABS: 650.0000 mg | ORAL_TABLET | Freq: Four times a day (QID) | ORAL | Status: DC | PRN

## 2020-02-09 MED ORDER — GLYCOPYRROLATE 1 MG PO TABS
1.0000 mg | ORAL_TABLET | ORAL | Status: DC | PRN
  Filled 2020-02-09: qty 1

## 2020-02-09 MED ORDER — DIPHENHYDRAMINE HCL 50 MG/ML IJ SOLN: 25.0000 mg | INTRAMUSCULAR | Status: DC | PRN

## 2020-02-09 MED ORDER — POTASSIUM CHLORIDE 20 MEQ PO PACK
40.0000 meq | PACK | Freq: Two times a day (BID) | ORAL | Status: DC
  Administered 2020-02-09: 10:00:00 40 meq
  Filled 2020-02-09: qty 2

## 2020-02-09 MED ORDER — GLYCOPYRROLATE 0.2 MG/ML IJ SOLN
0.2000 mg | INTRAMUSCULAR | Status: DC | PRN
  Administered 2020-02-09: 0.2 mg via INTRAVENOUS
  Filled 2020-02-09: qty 1

## 2020-02-09 MED ORDER — MIDAZOLAM HCL 2 MG/2ML IJ SOLN
2.0000 mg | Freq: Once | INTRAMUSCULAR | Status: AC
  Administered 2020-02-09: 2 mg via INTRAVENOUS

## 2020-02-09 MED ORDER — DEXTROSE 5 % IV SOLN: INTRAVENOUS | Status: DC

## 2020-02-09 MED ORDER — POLYVINYL ALCOHOL 1.4 % OP SOLN
1.0000 [drp] | Freq: Four times a day (QID) | OPHTHALMIC | Status: DC | PRN
  Filled 2020-02-09: qty 15

## 2020-02-09 MED ORDER — POTASSIUM CHLORIDE 20 MEQ PO PACK: 40.0000 meq | PACK | Freq: Two times a day (BID) | ORAL | Status: DC

## 2020-02-09 MED ORDER — MAGNESIUM OXIDE 400 (241.3 MG) MG PO TABS
400.0000 mg | ORAL_TABLET | Freq: Every day | ORAL | Status: DC
  Administered 2020-02-09: 400 mg
  Filled 2020-02-09: qty 1

## 2020-02-11 LAB — CULTURE, RESPIRATORY W GRAM STAIN
Culture: NORMAL
Special Requests: NORMAL

## 2020-02-14 LAB — CULTURE, BLOOD (ROUTINE X 2)
Culture: NO GROWTH
Culture: NO GROWTH
Special Requests: ADEQUATE
Special Requests: ADEQUATE

## 2020-03-05 NOTE — Progress Notes (Signed)
Dr. Arsenio Loader notified that patient looks to be having a seizure. Dr. Arsenio Loader assessed via video monitor-Versed 2mg  bolus ordered and given. Versed gtt restarted at 2mg  due to hypotension. Patient responded to both bolus and gtt and no longer appears to be having a seizure. Will cont to monitor.

## 2020-03-05 NOTE — Progress Notes (Signed)
eLink Physician-Brief Progress Note Patient Name: Pedro Mitchell DOB: [birth year] MRN: [id number]   Date of Service  02/24/2020  HPI/Events of Note  Called d/t ventilator asynchrony - Video assessment looks like patient is having seizure vs anoxic myoclonus. Currently on a Propofol IV infusion.   eICU Interventions  Will order: 1. Versed 2 mg IV X 1 now.  2. Restart Versed IV infusion.      Intervention Category Major Interventions: Seizures - evaluation and management;Respiratory failure - evaluation and management  Aiyanah Kalama Eugene 03/04/2020, 6:22 AM

## 2020-03-05 NOTE — Procedures (Addendum)
Patient Name:Pedro Mitchell URK:[id number] Epilepsy Attending:Tiffannie Sloss Annabelle Harman Referring Physician/Provider:Paul Mikey Bussing, NP Duration:02/08/2020 1522toMay 29, [id number]  Patient history:59 year old male status post cardiac arrest on TTM. EEG evaluate for seizures.  Level of alertness:Comatose  AEDs during EEG study:Propofol,versed, PHT,Keppra  Technical aspects: This EEG study was done with scalp electrodes positioned according to the 10-20 International system of electrode placement. Electrical activity was acquired at a sampling rate of 500Hz  and reviewed with a high frequency filter of 70Hz  and a low frequency filter of 1Hz . EEG data were recorded continuously and digitally stored.  Description:EEGshowed continuous generalized low amplitude 3 to 5 Hz theta-delta slowing as well intermittent generalized polyspikes. After around 5am, polyspikes became continuous. Versed was started around 630am after which eeg again showed low amplitude polymorphic 3-6hz  theta-delta slowing. Around 9am, EEG again showed continuous generalized polyspikes which improved after titrating sedation and showed intermittent low amplitude 3-6hz  theta-delta slowing lasting 2 to 5 seconds alternating with generalized background attenuation lasting 8 to 10 seconds  Event button was pressed on 02-Mar-2020 at 0550, 0618 forintermittent arm twitching. Concomitant EEG showed worsening generalized polyspikes suggestive of anoxic myoclonus.  Event button was pressed on 2020/03/02 at 0930. On video patient was noted to have eyelid twitching followed by bilateral shoulder and upper extremity myoclonic movements.  Concomitant EEG showed worsening generalized polyspikes suggestive of anoxic myoclonus.   ABNORMALITY - Myoclonic seizures, generalized - Polyspikes, generalized -Continuous slow, generalized  IMPRESSION: This studyshowed intermittent eyelid and bilateral shoulder and upper extremity twitching  movements consistent with anoxic myoclonic seizures. Additionally, there is evidence of profound diffuse encephalopathy, non specific to etiology but most likely secondary to anoxic/hypoxic brain injury, sedation.       Jinny Sweetland 

## 2020-03-05 NOTE — Progress Notes (Signed)
NAME:  Pedro Mitchell, MRN:  [id number], DOB:  1961-07-17, LOS: 4 ADMISSION DATE:  03/03/2020, CONSULTATION DATE:  5/3 REFERRING MD:  Dr. Stevie Kern, CHIEF COMPLAINT:  PEA arrest  Brief History   59 year old male admitted 5/3 after PEA arrest and witnessed seizure.   History of present illness   59 year old male with PMH as below, which is significant for ETOH abuse and recently seen in ED for single seizure discharged on Keppra. His mother reports he had been drinking in the PM hours of 5/2. In the AM hours of 5/3 she heard him collapse on the 2nd floor. She went up to find him shaking with seizure like movement. She called EMS and upon their arrival he had weak pulse, which he soon lost. He underwent immediate CPR for 10 mins until ROSC. EDP noted some seizure like movements. He was intubated in ED and PCCM was consulted.   Past Medical History   has a past medical history of Alcohol dependence (HCC).  Significant Hospital Events   5/3 Admit and started TTM 33  5/4 Rewarming. 5/4-5/5 Increased epileptiform activity - sedation continued and anticonvulsants increased.   Consults:    Procedures:  ETT 5/3 > CVL 5/3 >  Significant Diagnostic Tests:  CT head 5/3 > sequellae of prior SAH. Possible early anoxic injury.  EEG 5/3 > encephalopathic Echocardiogram 5/3 > mild LVH otherwise normal. EEG 5/7>> Myoclonic Seizures, generalized, Polyspikes, generalized, Continuous slow, generalized Intermittent twitching movements consistent with anoxic myoclonic seizures. 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  Micro Data:  MRSA negative MSSE in 2/4 blood cultures.   Antimicrobials:    Interim history/subjective:  Increasing frequency of epileptiform activity particularly with rewarming. Required an increase propofol, addition of versed and increase AED's EEG + for myoclonic seizures Versed  gtt restarted overnight for seizures per E Link Net - 167 T Max 99.7>> WBC 25 High risk aspiration>> No CXR Dilaudid and Propofol d/c'd 5/4 am. Within 5 minutes patient was having what appeared to be [employer] Versed resumed at 10 mg/ hr Propofol resumed at 50    Objective   Blood pressure 124/77, pulse 91, temperature 98.1 F (36.7 C), resp. rate 14, height 6\' 5"  (1.956 m), weight 87 kg, SpO2 100 %. CVP:  [7 mmHg-14 mmHg] 7 mmHg  Vent Mode: PSV;CPAP FiO2 (%):  [40 %] 40 % Set Rate:  [10 bmp] 10 bmp Vt Set:  [710 mL] 710 mL PEEP:  [8 cmH20] 8 cmH20 Pressure Support:  [10 cmH20] 10 cmH20 Plateau Pressure:  [20 cmH20-30 cmH20] 20 cmH20   Intake/Output Summary (Last 24 hours) at 2020/03/01 0956 Last data filed at 03-01-20 0820 Gross per 24 hour  Intake 1378.58 ml  Output 1895 ml  Net -516.42 ml   Filed Weights   02/07/20 0500 02/08/20 0500 03/01/2020 0500  Weight: 86.1 kg 87.8 kg 87 kg    Examination: General: middle aged male sedated and on vent HENT: [place]/AT, Pupils sluggish, no JVD, ETT secure, OG tube secure Lungs: Bilateral chest excursion, Clear, not overbreathing ventilator set rate Cardiovascular: S1, S2, RRR, no MRG Abdomen: Soft, non-tender, ND, BS diminished Extremities: no acute deformity or edema.  Neuro: no response to painful stimuli, no cranial reflexes on propofol and versed.  Personally reviewed EEG 5/7>> + for myoclonic seizures   Resolved Hospital Problem list     Assessment & Plan:   Critically ill  due to PEA Cardiac arrest requiring targeted temperature management.  Etiology likely due to prolonged ethoh withdrawal seizure..   - Maintaining 37C  - Tele monitoring  Acute hypoxemic respiratory failure: secondary to cardiac arrest - Full vent support. - SBT once rewarmed and off sedation.  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 Prior seizure disorder. Grand Mal appearing  seizure when sedation stopped 5/7 - Continue Versed at 10 mg/hr and Propofol at 50 - Consider MRI brain - Consider Neuro consult - Continue Keppra and Phenytoin - continuous EEG.  - Levophed for BP support while on sedation  Alcohol abuse - Thiamine, folic acid  MSSE positive blood cultures ( 5/3) - ?  contaminant T Max 99.7 WBC 25,000 High risk aspiration 2/2 seizure activity Plan Sputum Culture CXR now and  in am  Check PCT Consider adding antimicrobial coverage  Best practice:  Diet:  tube feeds.  Pain/Anxiety/Delirium protocol (if indicated): Versed and Propofol for seizure control. DVT prophylaxis: Heparin tid.  GI prophylaxis: PPI Glucose control: SSI - stopping Levemir due to hypoglycemia Mobility: BR Code Status: FULL Family Communication:Updated sister at the bedside and explained long-term prognosis.  Disposition: ICU  Labs   CBC: Recent Labs  Lab Mar 05, 2020 0842 2020-03-05 0843 02/06/20 0507 02/06/20 4008 02/06/20 0825 02/06/20 1124 02/07/20 0221 02/07/20 0421 02/07/20 6761 02/07/20 0747 02/23/2020 0435  WBC 15.6*  --  12.8*  --  16.2*  --   --   --   --   --  25.0*  NEUTROABS  --   --   --   --   --   --   --   --   --   --  21.8*  HGB 13.7   < > 14.3   < > 13.7   < > 13.6 13.9 13.3 13.6 11.4*  HCT 45.5   < > 41.3   < > 39.8   < > 40.0 41.0 39.0 40.0 35.6*  MCV 108.6*  --  93.4  --  93.6  --   --   --   --   --  98.6  PLT 183  --  162  --  160  --   --   --   --   --  144*   < > = values in this interval not displayed.    Basic Metabolic Panel: Recent Labs  Lab Mar 05, 2020 0842 Mar 05, 2020 0843 02/06/20 0507 02/06/20 9509 02/06/20 0825 02/06/20 1124 02/06/20 2148 02/06/20 2359 02/07/20 0420 02/07/20 0420 02/07/20 0421 02/07/20 3267 02/07/20 0747 02/07/20 1011 02/26/2020 0435  NA 137   < > 138   < > 136   < > 137   < > 136   < > 139 140 140 138 139  K 4.6   < > 2.9*   < > 3.5   < > 4.2   < > 4.3   < > 4.0 3.9 3.9 4.2 2.8*  CL 99   < > 103   <  > 105   < > 107   < > 105  --   --  105 104 108 102  CO2 14*   < > 21*   < > 21*  --  20*  --  18*  --   --   --   --  23 25  GLUCOSE 342*   < > 115*   < > 114*   < > 119*   < >  109*  --   --  118* 113* 109* 104*  BUN 10   < > 6   < > 6   < > 12   < > 15  --   --  14 14 11 19   CREATININE 1.11   < > 0.34*   < > 0.41*   < > 0.61   < > 0.70  --   --  0.60* 0.60* 0.67 0.66  CALCIUM 8.6*   < > 8.7*   < > 8.3*  --  7.8*  --  7.8*  --   --   --   --  7.9* 8.7*  MG 2.4  --  2.0  --   --   --   --   --   --   --   --   --   --   --   --   PHOS  --   --  1.1*  --   --   --  2.7  --   --   --   --   --   --   --   --    < > = values in this interval not displayed.   GFR: Estimated Creatinine Clearance: 125.4 mL/min (by C-G formula based on SCr of 0.66 mg/dL). Recent Labs  Lab 02/18/2020 0842 02/26/2020 1009 02/13/2020 1235 02/06/20 0507 02/06/20 0825 02/17/2020 0435  WBC 15.6*  --   --  12.8* 16.2* 25.0*  LATICACIDVEN >11.0* 7.4* 3.3*  --   --   --     Liver Function Tests: Recent Labs  Lab 02/08/20 0553  ALBUMIN 3.0*   No results for input(s): LIPASE, AMYLASE in the last 168 hours. No results for input(s): AMMONIA in the last 168 hours.  ABG    Component Value Date/Time   PHART 7.483 (H) 02/07/2020 0421   PCO2ART 26.9 (L) 02/07/2020 0421   PO2ART 127 (H) 02/07/2020 0421   HCO3 20.2 02/07/2020 0421   TCO2 24 02/07/2020 0747   ACIDBASEDEF 2.0 02/07/2020 0421   O2SAT 99.0 02/07/2020 0421     Coagulation Profile: Recent Labs  Lab 02/23/2020 1009 02/14/2020 1819  INR 1.1 1.1    Cardiac Enzymes: No results for input(s): CKTOTAL, CKMB, CKMBINDEX, TROPONINI in the last 168 hours.  HbA1C: Hgb A1c MFr Bld  Date/Time Value Ref Range Status  02/14/2020 10:09 AM 5.7 (H) 4.8 - 5.6 % Final    Comment:    (NOTE) Pre diabetes:          5.7%-6.4% Diabetes:              >6.4% Glycemic control for   <7.0% adults with diabetes     CBG: Recent Labs  Lab 02/08/20 1549 02/08/20 1947  02/07/2020 0013 02/13/2020 0417 02/26/2020 0739  GLUCAP 97 103* 115* 105* 107*   CRITICAL CARE Performed by: Magdalen Spatz   Total critical care time: 80 minutes  Magdalen Spatz, MSN, AGACNP-BC [place] for pager and assignments After 4 pm please call [phone] 02/11/2020 9:56 AM

## 2020-03-05 NOTE — Progress Notes (Signed)
Nutrition Brief Note  Chart reviewed. Pt now transitioning to comfort care.  No further nutrition interventions warranted at this time.  Please re-consult as needed.   Kelsie Kramp RD, LDN Clinical Nutrition Pager listed in AMION    

## 2020-03-05 NOTE — Progress Notes (Signed)
NAME:  Pedro Mitchell, MRN:  [id number], DOB:  1961/01/22, LOS: 20 ADMISSION DATE:  02/06/2020, CONSULTATION DATE:  5/3 REFERRING MD:  Dr. Roslynn Amble, CHIEF COMPLAINT:  PEA arrest  Brief History   59 year old male admitted 5/3 after PEA arrest and witnessed seizure.   History of present illness   59 year old male with PMH as below, which is significant for ETOH abuse and recently seen in ED for single seizure discharged on Keppra. His mother reports he had been drinking in the PM hours of 5/2. In the AM hours of 5/3 she heard him collapse on the 2nd floor. She went up to find him shaking with seizure like movement. She called EMS and upon their arrival he had weak pulse, which he soon lost. He underwent immediate CPR for 10 mins until ROSC. EDP noted some seizure like movements. He was intubated in ED and PCCM was consulted.   Past Medical History   has a past medical history of Alcohol dependence ([place]).  Significant Hospital Events   5/3 Admit and started TTM 33  5/4 Rewarming. 5/4-5/5 Increased epileptiform activity - sedation continued and anticonvulsants increased.   Consults:  Neurology.  Procedures:  ETT 5/3 > CVL 5/3 >  Significant Diagnostic Tests:  CT head 5/3 > sequellae of prior SAH. Possible early anoxic injury.  EEG 5/3 > encephalopathic Echocardiogram 5/3 > mild LVH otherwise normal.  Micro Data:  MRSA negative MSSE in 2/4 blood cultures.   Antimicrobials:  None  Interim history/subjective:  Recurrent seizure activity this morning.   Objective   Blood pressure 113/80, pulse 88, temperature 98.4 F (36.9 C), resp. rate 15, height 6\' 5"  (1.956 m), weight 87 kg, SpO2 100 %. CVP:  [7 mmHg-14 mmHg] 7 mmHg  Vent Mode: PRVC FiO2 (%):  [40 %] 40 % Set Rate:  [10 bmp-15 bmp] 15 bmp Vt Set:  [520 mL-710 mL] 520 mL PEEP:  [8 cmH20] 8 cmH20 Pressure Support:  [10 cmH20] 10 cmH20 Plateau Pressure:  [20 cmH20-30 cmH20] 21 cmH20   Intake/Output Summary (Last 24 hours)  at 02/23/2020 1429 Last data filed at 02/11/2020 1400 Gross per 24 hour  Intake 1346.05 ml  Output 2010 ml  Net -663.95 ml   Filed Weights   02/07/20 0500 02/08/20 0500 02/16/2020 0500  Weight: 86.1 kg 87.8 kg 87 kg    Examination: General: middle aged male on vent HENT: [place]/AT, Pupils sluggish, no JVD Lungs: Clear, not overbreathing ventilator Cardiovascular: RRR, no MRG Abdomen: Soft, non-tender Extremities: no acute deformity or edema.  Neuro: no response to painful stimuli, no cranial reflexes on propofol and versed. On sedation interruption: generalized myoclonus.  Personally reviewed continuous EEG which showed frequent myoclonic discharges.  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 Prior seizure disorder. - Refractory myoclonus portends poor prognosis.  - Seen by Dr Hortense Ramal - she explained dismal prognosis to family who have agreed to withdrawal of care. - Transitioned comfort care following conversation in family.      Labs   CBC: Recent Labs  Lab 02-06-20 0842 2020/02/06 9485 02/06/20 0507 02/06/20 4627 02/06/20 0825 02/06/20 1124 02/07/20 0221 02/07/20 0421 02/07/20 0350 02/07/20 0747 02/10/2020 0435  WBC 15.6*  --  12.8*  --  16.2*  --   --   --   --   --  25.0*  NEUTROABS  --   --   --   --   --   --   --   --   --   --  21.8*  HGB 13.7   < > 14.3   < > 13.7   < > 13.6 13.9 13.3 13.6 11.4*  HCT 45.5   < > 41.3   < > 39.8   < > 40.0 41.0 39.0 40.0 35.6*  MCV 108.6*  --  93.4  --  93.6  --   --   --   --   --  98.6  PLT 183  --  162  --  160  --   --   --   --   --  144*   < > = values in this interval not displayed.    Basic Metabolic Panel: Recent Labs  Lab 02-14-2020 0842 02-14-2020 7564 02/06/20 0507 02/06/20 3329 02/06/20 0825 02/06/20 1124 02/06/20 2148 02/06/20 2359 02/07/20 0420 02/07/20 0420 02/07/20 0421 02/07/20 5188  02/07/20 0747 02/07/20 1011 02/23/2020 0435  NA 137   < > 138   < > 136   < > 137   < > 136   < > 139 140 140 138 139  K 4.6   < > 2.9*   < > 3.5   < > 4.2   < > 4.3   < > 4.0 3.9 3.9 4.2 2.8*  CL 99   < > 103   < > 105   < > 107   < > 105  --   --  105 104 108 102  CO2 14*   < > 21*   < > 21*  --  20*  --  18*  --   --   --   --  23 25  GLUCOSE 342*   < > 115*   < > 114*   < > 119*   < > 109*  --   --  118* 113* 109* 104*  BUN 10   < > 6   < > 6   < > 12   < > 15  --   --  14 14 11 19   CREATININE 1.11   < > 0.34*   < > 0.41*   < > 0.61   < > 0.70  --   --  0.60* 0.60* 0.67 0.66  CALCIUM 8.6*   < > 8.7*   < > 8.3*  --  7.8*  --  7.8*  --   --   --   --  7.9* 8.7*  MG 2.4  --  2.0  --   --   --   --   --   --   --   --   --   --   --   --   PHOS  --   --  1.1*  --   --   --  2.7  --   --   --   --   --   --   --   --    < > = values in this interval not displayed.   GFR: Estimated Creatinine Clearance: 125.4 mL/min (by C-G formula based on SCr of 0.66 mg/dL). Recent Labs  Lab Feb 14, 2020 0842 02-14-2020 1009 02/14/20 1235 02/06/20 0507 02/06/20 0825 02/08/2020 0435 02/29/2020 1216  PROCALCITON  --   --   --   --   --   --  1.22  WBC 15.6*  --   --  12.8* 16.2* 25.0*  --   LATICACIDVEN >11.0* 7.4* 3.3*  --   --   --   --  Liver Function Tests: Recent Labs  Lab 02/08/20 0553  ALBUMIN 3.0*   No results for input(s): LIPASE, AMYLASE in the last 168 hours. No results for input(s): AMMONIA in the last 168 hours.  ABG    Component Value Date/Time   PHART 7.483 (H) 02/07/2020 0421   PCO2ART 26.9 (L) 02/07/2020 0421   PO2ART 127 (H) 02/07/2020 0421   HCO3 20.2 02/07/2020 0421   TCO2 24 02/07/2020 0747   ACIDBASEDEF 2.0 02/07/2020 0421   O2SAT 99.0 02/07/2020 0421     Coagulation Profile: Recent Labs  Lab 2020/02/10 1009 02-10-20 1819  INR 1.1 1.1    Cardiac Enzymes: No results for input(s): CKTOTAL, CKMB, CKMBINDEX, TROPONINI in the last 168 hours.  HbA1C: Hgb A1c MFr Bld   Date/Time Value Ref Range Status  02-10-20 10:09 AM 5.7 (H) 4.8 - 5.6 % Final    Comment:    (NOTE) Pre diabetes:          5.7%-6.4% Diabetes:              >6.4% Glycemic control for   <7.0% adults with diabetes     CBG: Recent Labs  Lab 02/08/20 1947 02/12/2020 0013 03/01/2020 0417 02/14/2020 0739 02/18/2020 1105  GLUCAP 103* 115* 105* 107* 98   CRITICAL CARE Performed by: Lynnell Catalan   Total critical care time: 35 minutes  Critical care time was exclusive of separately billable procedures and treating other patients.  Critical care was necessary to treat or prevent imminent or life-threatening deterioration.  Critical care was time spent personally by me on the following activities: development of treatment plan with patient and/or surrogate as well as nursing, discussions with consultants, evaluation of patient's response to treatment, examination of patient, obtaining history from patient or surrogate, ordering and performing treatments and interventions, ordering and review of laboratory studies, ordering and review of radiographic studies, pulse oximetry, re-evaluation of patient's condition and participation in multidisciplinary rounds.  Lynnell Catalan, MD [facility] ICU Physician [facility]  Pager: [phone] Mobile: [phone] After hours: [phone].   02/18/2020 2:29 PM

## 2020-03-05 NOTE — Progress Notes (Signed)
vLTM EEG complete. No skin breakdown 

## 2020-03-05 NOTE — Consult Note (Addendum)
Neurology Consultation Reason for Consult: Seizures, concern for anoxic brain injury Referring Physician: Dr. Thayer Headings  CC: Cardiac arrest  History is obtained from: Chart review as patient comatose, sedated.  HPI: Pedro Mitchell is a 59 y.o. male with past medical history of alcohol abuse who was brought in after PEA arrest and seizure-like activity on Feb 21, 2020.  Per review of HPI, patient's mother reported him drinking on 5/2 evening.  And morning of 5/3 she heard him collapse on the second floor.  When she went to check on him, he was noted to be having seizure-like movements.  EMS was called and found him to have a weak pulse which he later lost.  EMS started CPR for 10 minutes until ROSC was achieved.  After arrival, routine EEG was done while patient was on propofol which showed generalized background attenuation.  Long-term EEG showed generalized epileptogenicity as well as profound diffuse encephalopathy likely secondary to anoxic/hypoxic brain injury.  Patient was treated with propofol, Versed, Keppra and Dilantin however seizures persisted.  Therefore neurology was consulted today.   ROS: Unable to obtain due to altered mental status.   Past Medical History:  Diagnosis Date  . Alcohol dependence (HCC)     History reviewed.  Unable to obtain secondary to altered mental status  Social History: Per chart review he has never smoked. He has quit using smokeless tobacco. He reports current alcohol use. He reports that he does not use drugs.   Exam: Current vital signs: BP (!) 177/65   Pulse 96   Temp 98.4 F (36.9 C)   Resp 15   Ht 6\' 5"  (1.956 m)   Wt 87 kg   SpO2 100%   BMI 22.74 kg/m  Vital signs in last 24 hours: Temp:  [97.7 F (36.5 C)-99.3 F (37.4 C)] 98.4 F (36.9 C) (05/07 1210) Pulse Rate:  [81-120] 96 (05/07 1206) Resp:  [10-28] 15 (05/07 1206) BP: (107-177)/(61-108) 177/65 (05/07 1206) SpO2:  [96 %-100 %] 100 % (05/07 1206) Arterial Line BP:  (87-142)/(51-76) 104/60 (05/07 1200) FiO2 (%):  [40 %] 40 % (05/07 1206) Weight:  [87 kg] 87 kg (05/07 0500)   Physical Exam  Constitutional: Appears well-developed and well-nourished.  Psych: Unable to assess secondary to intubation and sedation Head: Normocephalic, atraumatic Cardiovascular: Normal rate and regular rhythm.  Respiratory: Intubated, not overbreathing the vent GI: Soft.  No distension Skin: Warm, no edema Neuro: Comatose, does not open eyes to noxious stimuli, pupils small to difficult to appreciate reactivity, corneal reflex absent on left and present on right, gag reflex present, withdraws to noxious stimuli in all 4 extremities  I have reviewed labs in epic and the results pertinent to this consultation are: WBC today 25, hemoglobin 11.4, platelets 144, potassium 2.8, glucose 104, calcium 8.7, albumin on 02/08/2020 3.0, triglycerides on 02/07/2020 212,  I have reviewed the images obtained: CT head without contrast 2020-02-21:  1. Suboptimal gray-white differentiation could be an early sign of anoxic injury but this is not definite. No brain swelling or reversible finding.  2. Cortical high-density at the right insula and temporal operculum, at site of recent subarachnoid hemorrhage, presumably small infarct with cortical laminar necrosis. 3. Negative for cervical spine fracture.  ASSESSMENT/PLAN: 59 year old male admitted after cardiac arrest and was noted to have myoclonic seizures.  Cardiac arrest Concern for anoxic brain injury Status myoclonus Leukocytosis Anemia Thrombocytopenia Hyperglycemia Hypokalemia Hypocalcemia Hypoalbuminemia Hypertriglyceridemia Acute respiratory failure post cardiac arrest -Patient was noted to have myoclonic seizures which  improved after titrating sedation. -However, it appears that it has been more than 72 hours and he continues to have myoclonic seizures with EEG showing generalized epileptiform discharges consistent with diffuse  anoxic brain injury.  Recommendations: -Continue current dose of propofol and Versed for symptomatic control of myoclonic seizures. -While I was examining the patient, patient's sister Pedro Mitchell came in the room.  I discussed that patient continues to have myoclonic seizures which are very difficult to control even after AEDs and sedation.  His CT head on arrival was already concerning for an anoxic brain injury.  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 [name]: Updated patient's family and I received a call from another patient sister who said she has discussed with patient's mother and children.  They understand the condition and would like to transition him to comfort care.  I told the sister that critical care team will contact patient's mother or POA to confirm the decision.  Dr. Doyne Keel was notified.  CRITICAL CARE Performed by: Lora Havens   Total critical care time: 35 minutes  Critical care time was exclusive of separately billable procedures and treating other patients.  Critical care was necessary to treat or prevent imminent or life-threatening deterioration.  Critical care was time spent personally by me on the following activities: development of treatment plan with patient and/or surrogate as well as nursing, discussions with consultants, evaluation of patient's response to treatment, examination of patient, obtaining history from patient or surrogate, ordering and performing treatments and interventions, ordering and review of laboratory studies, ordering and review of radiographic studies, pulse oximetry and re-evaluation of patient's condition.    [name] neurohospitalist

## 2020-03-05 NOTE — Death Summary Note (Signed)
DEATH SUMMARY   Patient Details  Name: Pedro Mitchell MRN: [id number] DOB: Jul 31, 1961  Admission/Discharge Information   Admit Date:  02/28/2020  Date of Death: Date of Death: 02/27/2020  Time of Death: Time of Death: 2029  Length of Stay: 4  Referring Physician: Patient, No Pcp Per   Reason(s) for Hospitalization  Cardiac arrest  Diagnoses  Preliminary cause of death: anoxic encephalopathy Secondary Diagnoses (including complications and co-morbidities):  Active Problems:   Cardiac arrest [facility](HCC) Myoclonic status Seizure disorder Alcohol abuse Respiratory failure  Brief Hospital Course (including significant findings, care, treatment, and services provided and events leading to death)  Pedro Mitchell is a 59 y.o. year old male who suffered a witnessed cardiac arrest following a seizure at home.  He has a history of alcohol abuse and prior seizure disorder for which she was not compliant with his anticonvulsants. He was made to the ICU for targeted temperature management.  He was started on high-dose sedative infusions for frequent runs of epileptiform activity which remained refractory to initiation of anticonvulsant therapy. We were unable to successfully wean the sedation without observing recurrence of myoclonic activity.  This was deemed to be a poor prognostic factor.  This was discussed with the family who agreed to proceed to compassionate extubation.    Pertinent Labs and Studies  Significant Diagnostic Studies CT Head Wo Contrast  Result Date: 02/12/2020 CLINICAL DATA:  Cardiac arrest. EXAM: CT HEAD WITHOUT CONTRAST CT CERVICAL SPINE WITHOUT CONTRAST TECHNIQUE: Multidetector CT imaging of the head and cervical spine was performed following the standard protocol without intravenous contrast. Multiplanar CT image reconstructions of the cervical spine were also generated. COMPARISON:  Head CT 01/01/2020 FINDINGS: CT HEAD FINDINGS Brain: High-density along the upper right  insular cortex and temporal operculum. Subarachnoid hemorrhage was seen in this area on prior but the current high-density is cortically based and likely from laminar necrosis-which would suggest an associated infarct. Chronic lacunes seen at the anterior limb left internal capsule. Although potentially affected by scanner parameters, the gray-white differentiation is suboptimal, but no swelling is seen. Generalized cerebral volume loss. Vascular: No hyperdense vessel when accounting for streak artifact. Atherosclerotic calcification especially at the right V4 segment. There was recent CTA. Skull: Negative for fracture Sinuses/Orbits: No evidence of injury CT CERVICAL SPINE FINDINGS Alignment: No traumatic malalignment Skull base and vertebrae: No acute fracture Soft tissues and spinal canal: No prevertebral fluid or swelling. No visible canal hematoma. Disc levels: C5-6 focal disc degeneration with uncovertebral ridging and right foraminal narrowing Upper chest: No acute finding Other: Unremarkable hardware where covered. IMPRESSION: 1. Suboptimal gray-white differentiation could be an early sign of anoxic injury but this is not definite. No brain swelling or reversible finding. 2. Cortical high-density at the right insula and temporal operculum, at site of recent subarachnoid hemorrhage, presumably small infarct with cortical laminar necrosis. 3. Negative for cervical spine fracture. Electronically Signed   By: Marnee SpringJonathon  Watts M.D.   On: Aug 24, 2020 10:59   CT Cervical Spine Wo Contrast  Result Date: 02/19/2020 CLINICAL DATA:  Cardiac arrest. EXAM: CT HEAD WITHOUT CONTRAST CT CERVICAL SPINE WITHOUT CONTRAST TECHNIQUE: Multidetector CT imaging of the head and cervical spine was performed following the standard protocol without intravenous contrast. Multiplanar CT image reconstructions of the cervical spine were also generated. COMPARISON:  Head CT 01/01/2020 FINDINGS: CT HEAD FINDINGS Brain: High-density along  the upper right insular cortex and temporal operculum. Subarachnoid hemorrhage was seen in this area on prior but the current  high-density is cortically based and likely from laminar necrosis-which would suggest an associated infarct. Chronic lacunes seen at the anterior limb left internal capsule. Although potentially affected by scanner parameters, the gray-white differentiation is suboptimal, but no swelling is seen. Generalized cerebral volume loss. Vascular: No hyperdense vessel when accounting for streak artifact. Atherosclerotic calcification especially at the right V4 segment. There was recent CTA. Skull: Negative for fracture Sinuses/Orbits: No evidence of injury CT CERVICAL SPINE FINDINGS Alignment: No traumatic malalignment Skull base and vertebrae: No acute fracture Soft tissues and spinal canal: No prevertebral fluid or swelling. No visible canal hematoma. Disc levels: C5-6 focal disc degeneration with uncovertebral ridging and right foraminal narrowing Upper chest: No acute finding Other: Unremarkable hardware where covered. IMPRESSION: 1. Suboptimal gray-white differentiation could be an early sign of anoxic injury but this is not definite. No brain swelling or reversible finding. 2. Cortical high-density at the right insula and temporal operculum, at site of recent subarachnoid hemorrhage, presumably small infarct with cortical laminar necrosis. 3. Negative for cervical spine fracture. Electronically Signed   By: Marnee Spring M.D.   On: 02/18/2020 10:59   DG CHEST PORT 1 VIEW  Result Date: 02/16/2020 CLINICAL DATA:  Fever.  Respiratory failure. EXAM: PORTABLE CHEST 1 VIEW COMPARISON:  02/06/2020 FINDINGS: Endotracheal tube in good position. NG tube enters the stomach with the tip not visualized. Right jugular central venous catheter tip mid SVC unchanged. No pneumothorax. Progression of right lower lobe infiltrate. Progression of left lower lobe infiltrate. No significant pleural effusion.  IMPRESSION: Progression of bibasilar infiltrates right greater than left. Possible pneumonia. Electronically Signed   By: Marlan Palau M.D.   On: 02/14/2020 11:20   DG CHEST PORT 1 VIEW  Result Date: 02/06/2020 CLINICAL DATA:  Evaluate ET tube placement EXAM: PORTABLE CHEST 1 VIEW COMPARISON:  2020/02/18 FINDINGS: Endotracheal tube tip is in satisfactory position above the carina. There is a right IJ catheter with tip at the cavoatrial junction. Normal heart size. Airspace consolidation within the right lung base is new from previous exam. IMPRESSION: 1. Satisfactory position of ET tube and right IJ catheter. 2. Right base airspace consolidation compatible with pneumonia and/or aspiration. Electronically Signed   By: Signa Kell M.D.   On: 02/06/2020 09:12   DG Chest Portable 1 View  Result Date: 02-18-20 CLINICAL DATA:  Status post intubation. Found unresponsive. EXAM: PORTABLE CHEST 1 VIEW COMPARISON:  None. FINDINGS: ET tube tip is just above the level of the carina. The NG tube appears under advanced. The side port is above the level of the GE junction and should be advanced. Normal heart size. No pleural effusion or edema. No airspace densities. IMPRESSION: 1. The side port of the nasogastric tube is above the level of the GE junction and should be advanced. 2. No acute cardiopulmonary abnormalities. 3. ET tube tip is just above the level of the carina. Electronically Signed   By: Signa Kell M.D.   On: 02/18/2020 09:08   EEG adult  Result Date: 02/18/2020 Charlsie Quest, MD     02-18-2020  3:36 PM Patient Name: NITESH PITSTICK MRN: [id number] Epilepsy Attending: Charlsie Quest Referring Physician/Provider: Joneen Roach, NP Date: 02/18/20 Duration: 24.51 mins Patient history: 59 year old male status post cardiac arrest on TTM.  EEG evaluate for seizures. Level of alertness: Comatose AEDs during EEG study: Propofol Technical aspects: This EEG study was done with scalp electrodes positioned  according to the 10-20 International system of electrode placement. Electrical activity was  acquired at a sampling rate of  and reviewed with a high frequency filter of  and a low frequency filter of . EEG data were recorded continuously and digitally stored. Description: EEG showed continuous generalized background attenuation. EEG was not reactive to noxious stimuli. Hyperventilation and photic stimulation were not performed. ABNORMALITY - Background attenuation, generalized IMPRESSION: This study is suggestive of profound diffuse encephalopathy, non specific to etiology but could be secondary to anoxic/hypoxic brain injury, sedation. No seizures or epileptiform discharges were seen throughout the recording. Priyanka Annabelle Harman   Overnight EEG with video  Result Date: 02/06/2020 Charlsie Quest, MD     02/07/2020  8:53 AM Patient Name: CONN TROMBETTA MRN: [id number] Epilepsy Attending: Charlsie Quest Referring Physician/Provider: Joneen Roach, NP Duration: 02/22/2020 1522 to 02/06/2020 1522  Patient history: 59 year old male status post cardiac arrest on TTM.  EEG evaluate for seizures.  Level of alertness: Comatose  AEDs during EEG study: Propofol, Keppra  Technical aspects: This EEG study was done with scalp electrodes positioned according to the 10-20 International system of electrode placement. Electrical activity was acquired at a sampling rate of  and reviewed with a high frequency filter of  and a low frequency filter of . EEG data were recorded continuously and digitally stored.  Description: EEG showed continuous generalized low amplitude polymorphic 3-6hz  theta-delta slowing. Intermittent generalized maximal bilateral posterior quadrant polyspikes were also seen, which lasted 2-10 seconds. Gradually after around 9am, eeg showed generalized (left > right) polyspikes initially lasting 2-3 seconds alternating with 2-3 seconds of background suppression which gradually increased to  continuous generalized (L>R) polyspikes.Hyperventilation and photic stimulation were not performed. ABNORMALITY - Polyspikes, generalized, Left>right - Continuous slow, generalized  IMPRESSION: This study showed evidence of generalized epileptogenicity as well as profound diffuse encephalopathy, non specific to etiology but could be secondary to anoxic/hypoxic brain injury, sedation. No definite seizures were seen throughout the recording.  Charlsie Quest   ECHOCARDIOGRAM COMPLETE  Result Date: 02/24/2020    ECHOCARDIOGRAM REPORT   Patient Name:   ALEXANDRE FARIES Date of Exam: 02/04/2020 Medical Rec #:  [id number]        Height:       77.0 in Accession #:    [id number]       Weight:       186.9 lb Date of Birth:  03-06-1961        BSA:          2.174 m Patient Age:    58 years         BP:           186/85 mmHg Patient Gender: M                HR:           57 bpm. Exam Location:  Inpatient Procedure: 2D Echo, Color Doppler and Cardiac Doppler Indications:    Cardiac Arrest i46.9  History:        Patient has no prior history of Echocardiogram examinations.                 ETOH Abuse.  Sonographer:    Irving Burton Senior RDCS Referring Phys: [phone] Clarene Critchley [facility]  Sonographer Comments: Very low windows, suboptimal parasternals and suprasternal notch. IMPRESSIONS  1. Left ventricular ejection fraction, by estimation, is 65 to 70%. The left ventricle has normal function. The left ventricle has no regional wall motion abnormalities. There is mild left ventricular hypertrophy. Left ventricular diastolic parameters  were normal.  2. Right ventricular systolic function is normal. The right ventricular size is normal. There is normal pulmonary artery systolic pressure.  3. The mitral valve is normal in structure. No evidence of mitral valve regurgitation. No evidence of mitral stenosis.  4. The aortic valve is normal in structure. Aortic valve regurgitation is not visualized. No aortic stenosis is present.  5. The inferior vena  cava is dilated in size with <50% respiratory variability, suggesting right atrial pressure of 15 mmHg. FINDINGS  Left Ventricle: Left ventricular ejection fraction, by estimation, is 65 to 70%. The left ventricle has normal function. The left ventricle has no regional wall motion abnormalities. The left ventricular internal cavity size was normal in size. There is  mild left ventricular hypertrophy. Left ventricular diastolic parameters were normal. Right Ventricle: The right ventricular size is normal. No increase in right ventricular wall thickness. Right ventricular systolic function is normal. There is normal pulmonary artery systolic pressure. The tricuspid regurgitant velocity is 2.21 m/s, and  with an assumed right atrial pressure of 15 mmHg, the estimated right ventricular systolic pressure is 34.5 mmHg. Left Atrium: Left atrial size was normal in size. Right Atrium: Right atrial size was normal in size. Pericardium: There is no evidence of pericardial effusion. Mitral Valve: The mitral valve is normal in structure. Normal mobility of the mitral valve leaflets. No evidence of mitral valve regurgitation. No evidence of mitral valve stenosis. Tricuspid Valve: The tricuspid valve is normal in structure. Tricuspid valve regurgitation is mild . No evidence of tricuspid stenosis. Aortic Valve: The aortic valve is normal in structure. Aortic valve regurgitation is not visualized. No aortic stenosis is present. Pulmonic Valve: The pulmonic valve was normal in structure. Pulmonic valve regurgitation is not visualized. No evidence of pulmonic stenosis. Aorta: The aortic root is normal in size and structure. Venous: The inferior vena cava is dilated in size with less than 50% respiratory variability, suggesting right atrial pressure of 15 mmHg. IAS/Shunts: No atrial level shunt detected by color flow Doppler.  LEFT VENTRICLE PLAX 2D LVIDd:         3.60 cm  Diastology LVIDs:         2.35 cm  LV e' lateral:   7.29 cm/s  LV PW:         1.44 cm  LV E/e' lateral: 9.7 LV IVS:        1.09 cm  LV e' medial:    7.40 cm/s LVOT diam:     2.20 cm  LV E/e' medial:  9.6 LV SV:         62 LV SV Index:   29 LVOT Area:     3.80 cm  RIGHT VENTRICLE RV S prime:     19.50 cm/s TAPSE (M-mode): 2.0 cm LEFT ATRIUM             Index       RIGHT ATRIUM           Index LA diam:        3.00 cm 1.38 cm/m  RA Area:     17.60 cm LA Vol (A2C):   47.7 ml 21.95 ml/m RA Volume:   48.50 ml  22.31 ml/m LA Vol (A4C):   63.3 ml 29.12 ml/m LA Biplane Vol: 56.6 ml 26.04 ml/m  AORTIC VALVE LVOT Vmax:   92.40 cm/s LVOT Vmean:  58.600 cm/s LVOT VTI:    0.163 m  AORTA Ao Root diam: 3.40 cm MITRAL VALVE  TRICUSPID VALVE MV Area (PHT): 2.52 cm    TR Peak grad:   19.5 mmHg MV Decel Time: 301 msec    TR Vmax:        221.00 cm/s MV E velocity: 70.80 cm/s MV A velocity: 50.90 cm/s  SHUNTS MV E/A ratio:  1.39        Systemic VTI:  0.16 m                            Systemic Diam: 2.20 cm Donato Schultz MD Electronically signed by Donato Schultz MD Signature Date/Time: 02/23/2020/4:22:25 PM    Final     Microbiology Recent Results (from the past 240 hour(s))  Respiratory Panel by RT PCR (Flu A&B, Covid) - Nasopharyngeal Swab     Status: None   Collection Time: 02/07/2020  8:42 AM   Specimen: Nasopharyngeal Swab  Result Value Ref Range Status   SARS Coronavirus 2 by RT PCR NEGATIVE NEGATIVE Final    Comment: (NOTE) SARS-CoV-2 target nucleic acids are NOT DETECTED. The SARS-CoV-2 RNA is generally detectable in upper respiratoy specimens during the acute phase of infection. The lowest concentration of SARS-CoV-2 viral copies this assay can detect is 131 copies/mL. A negative result does not preclude SARS-Cov-2 infection and should not be used as the sole basis for treatment or other patient management decisions. A negative result may occur with  improper specimen collection/handling, submission of specimen other than nasopharyngeal swab, presence of viral  mutation(s) within the areas targeted by this assay, and inadequate number of viral copies (<131 copies/mL). A negative result must be combined with clinical observations, patient history, and epidemiological information. The expected result is Negative. Fact Sheet for Patients:  https://www.moore.com/ Fact Sheet for Healthcare Providers:  https://www.young.biz/ This test is not yet ap proved or cleared by the [place] FDA and  has been authorized for detection and/or diagnosis of SARS-CoV-2 by FDA under an Emergency Use Authorization (EUA). This EUA will remain  in effect (meaning this test can be used) for the duration of the COVID-19 declaration under Section 564(b)(1) of the Act, 21 U.S.C. section 360bbb-3(b)(1), unless the authorization is terminated or revoked sooner.    Influenza A by PCR NEGATIVE NEGATIVE Final   Influenza B by PCR NEGATIVE NEGATIVE Final    Comment: (NOTE) The Xpert Xpress SARS-CoV-2/FLU/RSV assay is intended as an aid in  the diagnosis of influenza from Nasopharyngeal swab specimens and  should not be used as a sole basis for treatment. Nasal washings and  aspirates are unacceptable for Xpert Xpress SARS-CoV-2/FLU/RSV  testing. Fact Sheet for Patients: https://www.moore.com/ Fact Sheet for Healthcare Providers: https://www.young.biz/ This test is not yet approved or cleared by the [place] FDA and  has been authorized for detection and/or diagnosis of SARS-CoV-2 by  FDA under an Emergency Use Authorization (EUA). This EUA will remain  in effect (meaning this test can be used) for the duration of the  Covid-19 declaration under Section 564(b)(1) of the Act, 21  U.S.C. section 360bbb-3(b)(1), unless the authorization is  terminated or revoked. Performed at [facility], [address] [place], [place] [address]   Blood culture (routine x 2)     Status: Abnormal    Collection Time: 02/12/2020  9:56 AM   Specimen: BLOOD  Result Value Ref Range Status   Specimen Description BLOOD RIGHT ANTECUBITAL  Final   Special Requests AEROBIC BOTTLE ONLY Blood Culture adequate volume  Final  Culture  Setup Time   Final    GRAM POSITIVE COCCI IN CLUSTERS AEROBIC BOTTLE ONLY CRITICAL VALUE NOTED.  VALUE IS CONSISTENT WITH PREVIOUSLY REPORTED AND CALLED VALUE.    Culture (A)  Final    STAPHYLOCOCCUS HOMINIS SUSCEPTIBILITIES PERFORMED ON PREVIOUS CULTURE WITHIN THE LAST 5 DAYS. Performed at [facility], [address] [place], [place] [address]    Report Status Feb 15, 2020 FINAL  Final  Blood culture (routine x 2)     Status: Abnormal   Collection Time: 02/17/2020  9:57 AM   Specimen: BLOOD  Result Value Ref Range Status   Specimen Description BLOOD LEFT ANTECUBITAL  Final   Special Requests   Final    BOTTLES DRAWN AEROBIC AND ANAEROBIC Blood Culture adequate volume   Culture  Setup Time   Final    GRAM POSITIVE COCCI IN CLUSTERS CRITICAL RESULT CALLED TO, READ BACK BY AND VERIFIED WITH: PHARMD LAURA SEAY AT 0645 ON 02/06/2020 BY MESSAN HOUEGNIFIO ANAEROBIC BOTTLE ONLY    Culture (A)  Final    STAPHYLOCOCCUS HOMINIS STAPHYLOCOCCUS AURICULARIS THE SIGNIFICANCE OF ISOLATING THIS ORGANISM FROM A SINGLE SET OF BLOOD CULTURES WHEN MULTIPLE SETS ARE DRAWN IS UNCERTAIN. PLEASE NOTIFY THE MICROBIOLOGY DEPARTMENT WITHIN ONE WEEK IF SPECIATION AND SENSITIVITIES ARE REQUIRED. Performed at [facility], [address] [place], [place] [address]    Report Status 15-Feb-2020 FINAL  Final   Organism ID, Bacteria STAPHYLOCOCCUS HOMINIS  Final      Susceptibility   Staphylococcus hominis - MIC*    CIPROFLOXACIN <=0.5 SENSITIVE Sensitive     ERYTHROMYCIN >=8 RESISTANT Resistant     GENTAMICIN <=0.5 SENSITIVE Sensitive     OXACILLIN <=0.25 SENSITIVE Sensitive     TETRACYCLINE <=1 SENSITIVE Sensitive     VANCOMYCIN <=0.5 SENSITIVE Sensitive     TRIMETH/SULFA  <=10 SENSITIVE Sensitive     CLINDAMYCIN <=0.25 SENSITIVE Sensitive     RIFAMPIN <=0.5 SENSITIVE Sensitive     Inducible Clindamycin NEGATIVE Sensitive     * STAPHYLOCOCCUS HOMINIS  Blood Culture ID Panel (Reflexed)     Status: Abnormal   Collection Time: 02/20/2020  9:57 AM  Result Value Ref Range Status   Enterococcus species NOT DETECTED NOT DETECTED Final   Listeria monocytogenes NOT DETECTED NOT DETECTED Final   Staphylococcus species DETECTED (A) NOT DETECTED Final    Comment: Methicillin (oxacillin) susceptible coagulase negative staphylococcus. Possible blood culture contaminant (unless isolated from more than one blood culture draw or clinical case suggests pathogenicity). No antibiotic treatment is indicated for blood  culture contaminants. CRITICAL RESULT CALLED TO, READ BACK BY AND VERIFIED WITH: PHARMD LAURA SEAY AT 0645 BY MESSAN HOUEGNIFIO ON 02/06/2020    Staphylococcus aureus (BCID) NOT DETECTED NOT DETECTED Final   Methicillin resistance NOT DETECTED NOT DETECTED Final   Streptococcus species NOT DETECTED NOT DETECTED Final   Streptococcus agalactiae NOT DETECTED NOT DETECTED Final   Streptococcus pneumoniae NOT DETECTED NOT DETECTED Final   Streptococcus pyogenes NOT DETECTED NOT DETECTED Final   Acinetobacter baumannii NOT DETECTED NOT DETECTED Final   Enterobacteriaceae species NOT DETECTED NOT DETECTED Final   Enterobacter cloacae complex NOT DETECTED NOT DETECTED Final   Escherichia coli NOT DETECTED NOT DETECTED Final   Klebsiella oxytoca NOT DETECTED NOT DETECTED Final   Klebsiella pneumoniae NOT DETECTED NOT DETECTED Final   Proteus species NOT DETECTED NOT DETECTED Final   Serratia marcescens NOT DETECTED NOT DETECTED Final   Haemophilus influenzae NOT DETECTED NOT DETECTED Final  Neisseria meningitidis NOT DETECTED NOT DETECTED Final   Pseudomonas aeruginosa NOT DETECTED NOT DETECTED Final   Candida albicans NOT DETECTED NOT DETECTED Final   Candida glabrata  NOT DETECTED NOT DETECTED Final   Candida krusei NOT DETECTED NOT DETECTED Final   Candida parapsilosis NOT DETECTED NOT DETECTED Final   Candida tropicalis NOT DETECTED NOT DETECTED Final    Comment: Performed at [name], [address], [place], [place] [address]  MRSA PCR Screening     Status: None   Collection Time: 02/21/2020  3:41 PM   Specimen: Nasal Mucosa; Nasopharyngeal  Result Value Ref Range Status   MRSA by PCR NEGATIVE NEGATIVE Final    Comment:        The GeneXpert MRSA Assay (FDA approved for NASAL specimens only), is one component of a comprehensive MRSA colonization surveillance program. It is not intended to diagnose MRSA infection nor to guide or monitor treatment for MRSA infections. Performed at [facility], [address] [place], [place] [address]   Culture, respiratory (non-expectorated)     Status: None   Collection Time: 02/19/2020 10:30 AM   Specimen: Tracheal Aspirate; Respiratory  Result Value Ref Range Status   Specimen Description TRACHEAL ASPIRATE  Final   Special Requests Normal  Final   Gram Stain   Final    FEW WBC PRESENT, PREDOMINANTLY PMN ABUNDANT GRAM POSITIVE RODS FEW GRAM NEGATIVE RODS FEW GRAM NEGATIVE COCCOBACILLI    Culture   Final    Consistent with normal respiratory flora. Performed at [facility], [address] [place], [place] [address]    Report Status 02/11/2020 FINAL  Final  Culture, blood (routine x 2)     Status: None (Preliminary result)   Collection Time: 02/11/2020  1:47 PM   Specimen: BLOOD LEFT HAND  Result Value Ref Range Status   Specimen Description BLOOD LEFT HAND  Final   Special Requests AEROBIC BOTTLE ONLY Blood Culture adequate volume  Final   Culture   Final    NO GROWTH 3 DAYS Performed at [facility], [address], [place], [place] [address]    Report Status PENDING  Incomplete  Culture, blood (routine x 2)     Status: None (Preliminary result)   Collection Time:  02/23/2020  1:48 PM   Specimen: BLOOD LEFT HAND  Result Value Ref Range Status   Specimen Description BLOOD LEFT HAND  Final   Special Requests AEROBIC BOTTLE ONLY Blood Culture adequate volume  Final   Culture   Final    NO GROWTH 3 DAYS Performed at [facility], [address] [place], [place] [address]    Report Status PENDING  Incomplete    Lab Basic Metabolic Panel: Recent Labs  Lab 02/06/20 0507 02/06/20 1308 02/06/20 0825 02/06/20 1124 02/06/20 2148 02/06/20 2359 02/07/20 0420 02/07/20 0420 02/07/20 0421 02/07/20 6578 02/07/20 0747 02/07/20 1011 02/10/2020 0435  NA 138   < > 136   < > 137   < > 136   < > 139 140 140 138 139  K 2.9*   < > 3.5   < > 4.2   < > 4.3   < > 4.0 3.9 3.9 4.2 2.8*  CL 103   < > 105   < > 107   < > 105  --   --  105 104 108 102  CO2 21*   < > 21*  --  20*  --  18*  --   --   --   --  23 25  GLUCOSE 115*   < > 114*   < > 119*   < > 109*  --   --  118* 113* 109* 104*  BUN 6   < > 6   < > 12   < > 15  --   --  14 14 11 19   CREATININE 0.34*   < > 0.41*   < > 0.61   < > 0.70  --   --  0.60* 0.60* 0.67 0.66  CALCIUM 8.7*   < > 8.3*  --  7.8*  --  7.8*  --   --   --   --  7.9* 8.7*  MG 2.0  --   --   --   --   --   --   --   --   --   --   --   --   PHOS 1.1*  --   --   --  2.7  --   --   --   --   --   --   --   --    < > = values in this interval not displayed.   Liver Function Tests: Recent Labs  Lab 02/08/20 0553  ALBUMIN 3.0*   No results for input(s): LIPASE, AMYLASE in the last 168 hours. No results for input(s): AMMONIA in the last 168 hours. CBC: Recent Labs  Lab 02/06/20 0507 02/06/20 5329 02/06/20 0825 02/06/20 1124 02/07/20 0221 02/07/20 0421 02/07/20 9242 02/07/20 0747 29-Feb-2020 0435  WBC 12.8*  --  16.2*  --   --   --   --   --  25.0*  NEUTROABS  --   --   --   --   --   --   --   --  21.8*  HGB 14.3   < > 13.7   < > 13.6 13.9 13.3 13.6 11.4*  HCT 41.3   < > 39.8   < > 40.0 41.0 39.0 40.0 35.6*  MCV 93.4  --   93.6  --   --   --   --   --  98.6  PLT 162  --  160  --   --   --   --   --  144*   < > = values in this interval not displayed.   Cardiac Enzymes: No results for input(s): CKTOTAL, CKMB, CKMBINDEX, TROPONINI in the last 168 hours. Sepsis Labs: Recent Labs  Lab 02/06/20 0507 02/06/20 0825 02/29/20 0435 02/29/20 1216  PROCALCITON  --   --   --  1.22  WBC 12.8* 16.2* 25.0*  --     Procedures/Operations  Mechanical ventilation, continuous EEG monitoring.   Paisli Silfies 02/12/2020, 1:49 PM

## 2020-03-05 NOTE — Progress Notes (Signed)
LTM maint complete - no skin breakdown under:Fp1  Fp2  F4  F3  F7  F8 P7

## 2020-03-05 NOTE — Procedures (Signed)
Extubation Procedure Note  Patient Details:   Name: Pedro Mitchell DOB: 1961/09/02 MRN: [id number]   Airway Documentation:    Vent end date: 03/05/20 Vent end time: 1830   Evaluation  O2 sats: stable throughout Complications: Complications of terminal extubation per family wishes. Patient did tolerate procedure well. Bilateral Breath Sounds: Clear, Diminished   No   Patient extubated to comfort care per family wishes and MD order. Positive cuff leak was noted prior to extubation. Patient friend and RN are at bedside. RT will continue to monitor.   [name] March 05, 2020, 6:33 PM

## 2020-03-05 NOTE — Progress Notes (Signed)
Pt placed on CPAP/PS 5/5 and 30% per CDS request. RT will continue to monitor.

## 2020-03-05 DEATH — deceased

## 2020-11-24 IMAGING — CT CT CERVICAL SPINE W/O CM
5 of 7 series · 12 of 33 positions shown, 14 images · non-contrast
Comparison: Head CT 01/01/2020

CLINICAL DATA: Cardiac arrest.

EXAM:
CT HEAD WITHOUT CONTRAST
CT CERVICAL SPINE WITHOUT CONTRAST
TECHNIQUE: Multidetector CT imaging of the head and cervical spine was
performed following the standard protocol without intravenous
contrast. Multiplanar CT image reconstructions of the cervical spine
were also generated.

[Series 5: head bone · axial · 0.45mm/px · z∈[-67,-11]mm · 2 of 85 slices shown]
[im 29/85  bone]
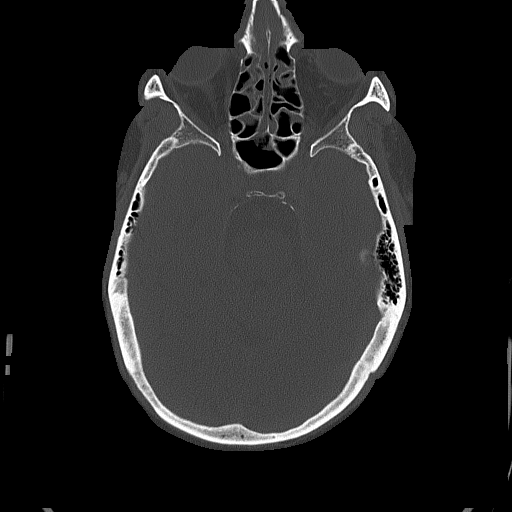
[im 57/85  bone]
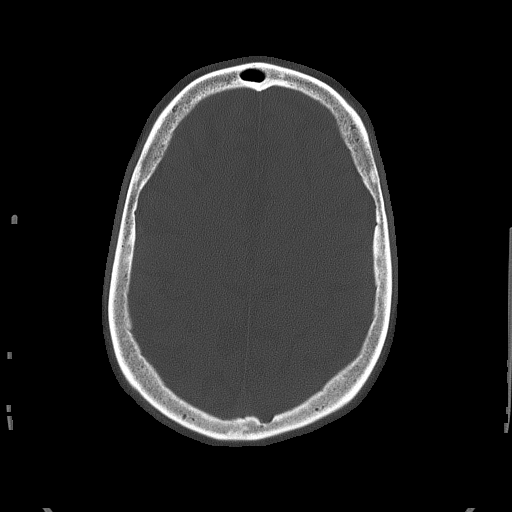

[Series 6: c_spine 2.0 st · axial · 0.46mm/px · z∈[-240,-170]mm · 2 of 105 slices shown, 3 images]
[im 35/105  soft-tissue]
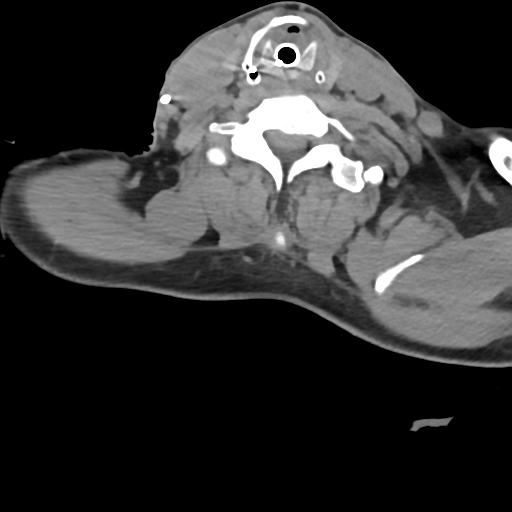
[im 35/105  bone]
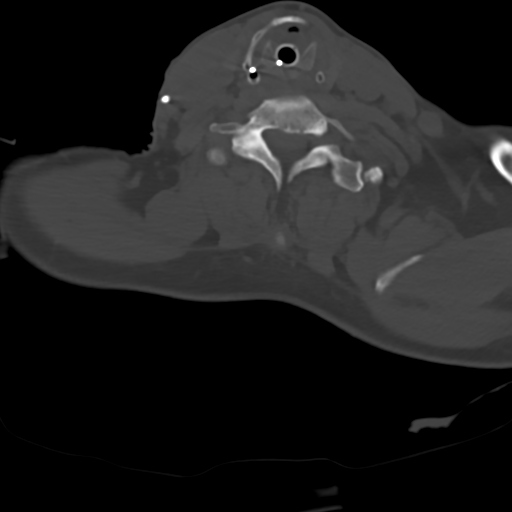
[im 70/105  bone]
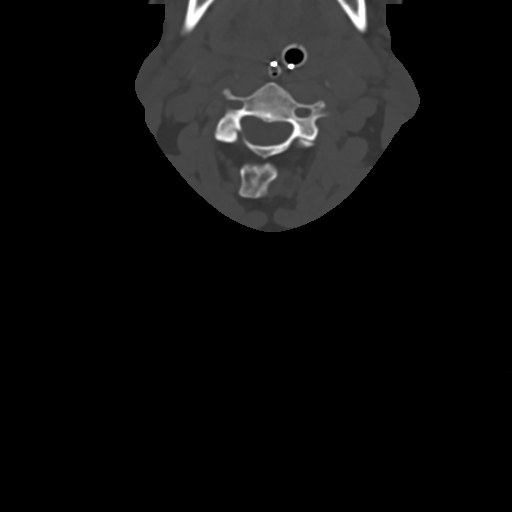

[Series 10: c_spine 2.0 orthogonals · axial · 0.21mm/px · z∈[-260,-188]mm · 2 of 106 slices shown]
[im 36/106  bone]
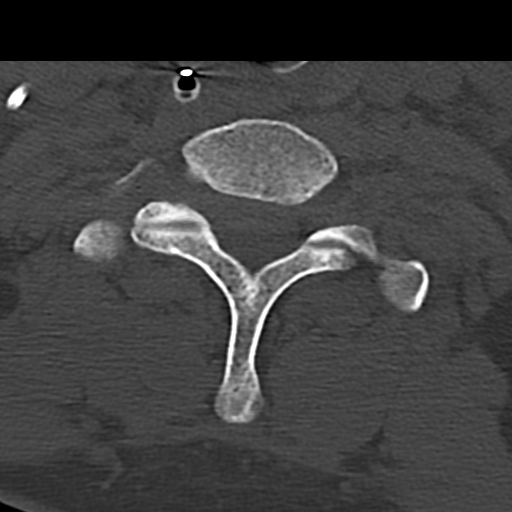
[im 71/106  bone]
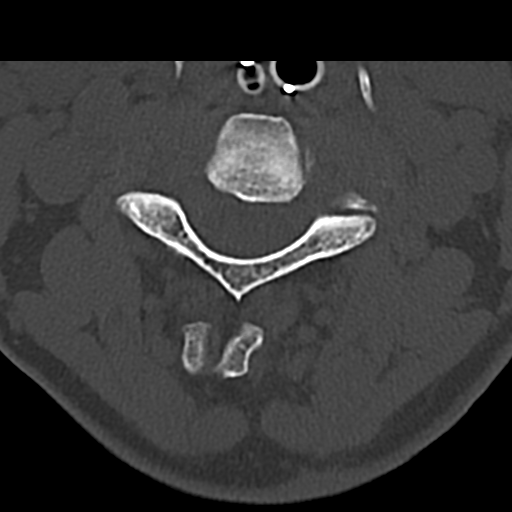

[Series 12: c_spine 2.0 sag bone · sagittal · 0.33mm/px · 5 of 80 slices shown, 6 images]
[im 27/80  bone]
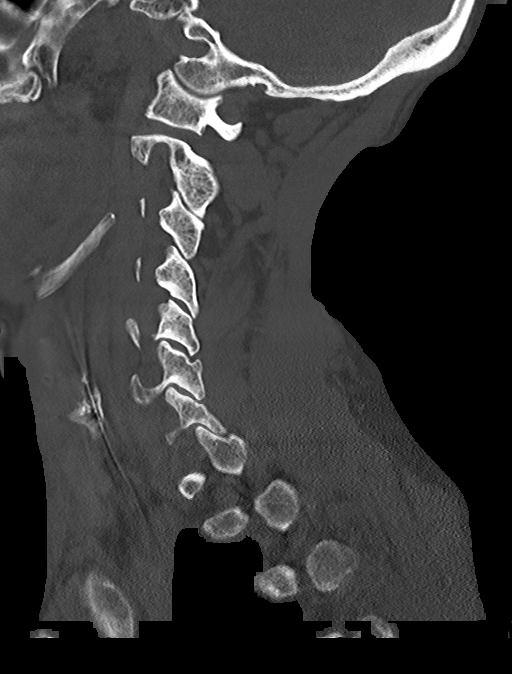
[im 33/80  bone]
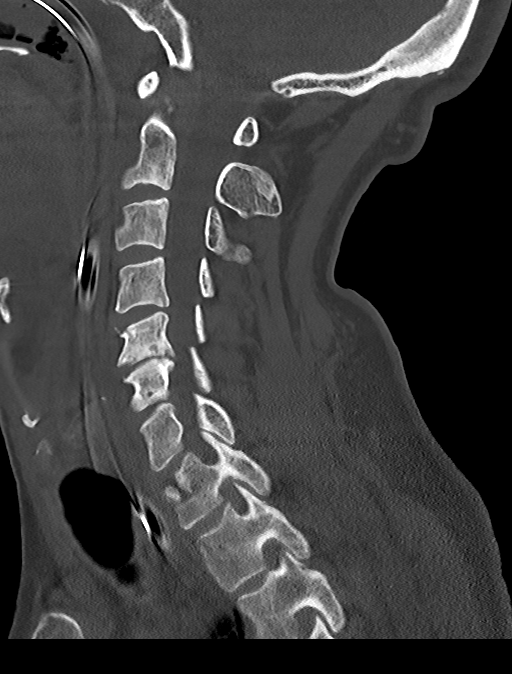
[im 40/80  soft-tissue]
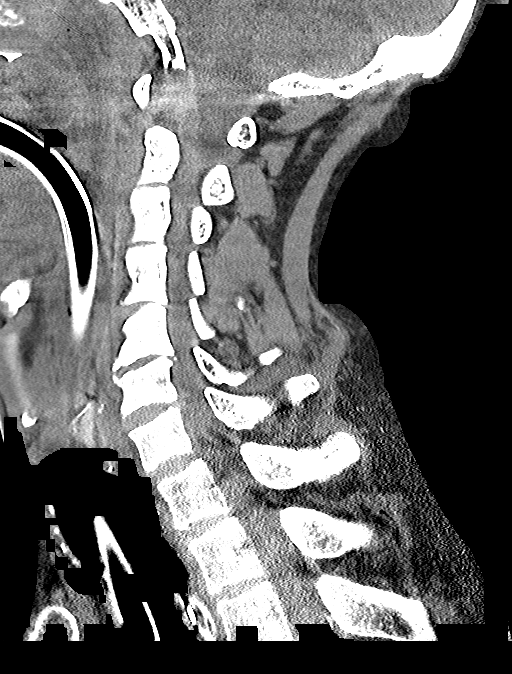
[im 40/80  bone]
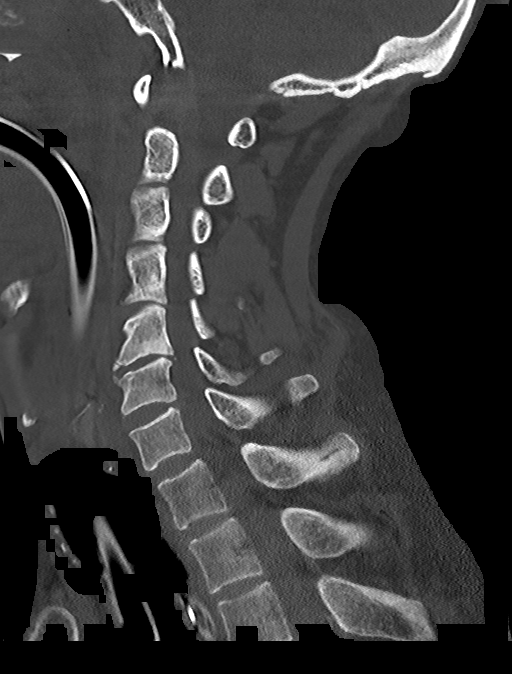
[im 47/80  bone]
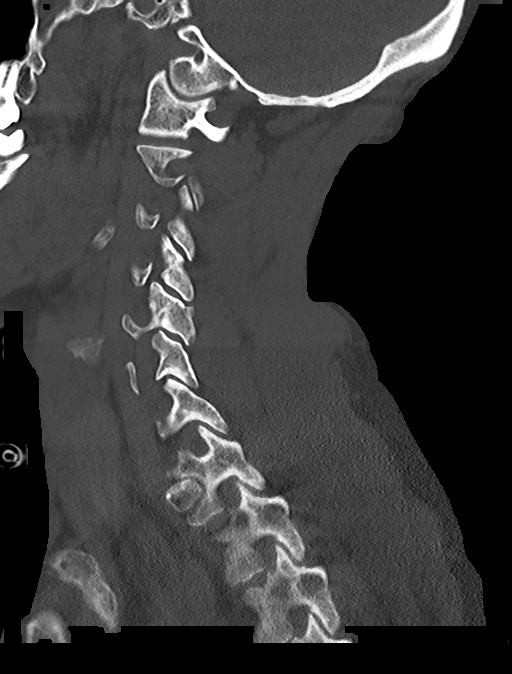
[im 53/80  bone]
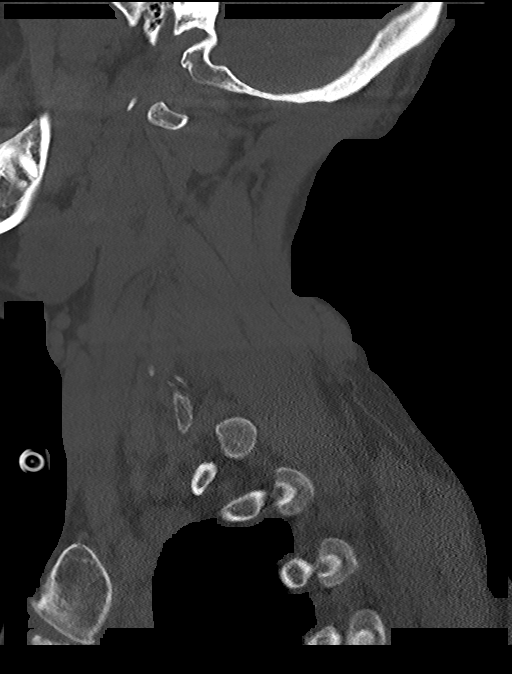

[Series 13: c_spine 2.0 cor bone · coronal · 0.31mm/px · 1 of 79 slices shown]
[im 40/79  bone]
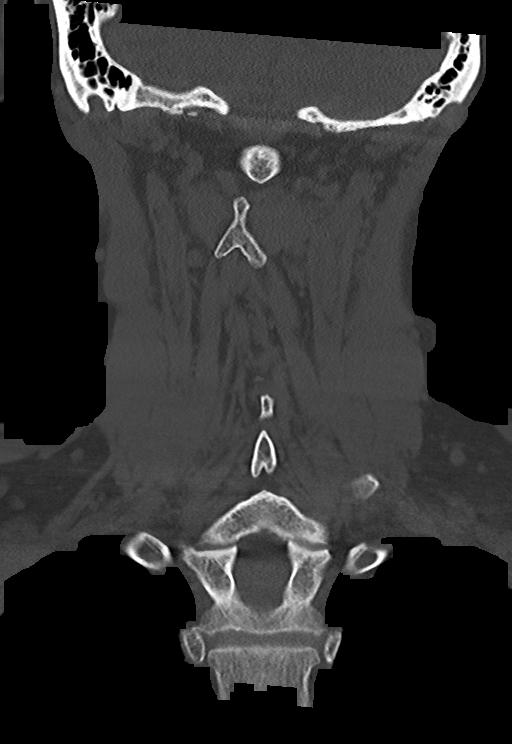

[12 of 33 positions shown; findings below may reference images not displayed]

FINDINGS: CT HEAD FINDINGS

Brain: High-density along the upper right insular cortex and
temporal operculum. Subarachnoid hemorrhage was seen in this area on
prior but the current high-density is cortically based and likely
from laminar necrosis-which would suggest an associated infarct.

Chronic lacunes seen at the anterior limb left internal capsule.
Although potentially affected by scanner parameters, the gray-white
differentiation is suboptimal, but no swelling is seen. Generalized
cerebral volume loss.

Vascular: No hyperdense vessel when accounting for streak artifact.
Atherosclerotic calcification especially at the right V4 segment.
There was recent CTA.

Skull: Negative for fracture

Sinuses/Orbits: No evidence of injury

CT CERVICAL SPINE FINDINGS

Alignment: No traumatic malalignment

Skull base and vertebrae: No acute fracture

Soft tissues and spinal canal: No prevertebral fluid or swelling. No
visible canal hematoma.

Disc levels: C5-6 focal disc degeneration with uncovertebral ridging
and right foraminal narrowing

Upper chest: No acute finding

Other: Unremarkable hardware where covered.
IMPRESSION: 1. Suboptimal gray-white differentiation could be an early sign of
anoxic injury but this is not definite. No brain swelling or
reversible finding.
2. Cortical high-density at the right insula and temporal operculum,
at site of recent subarachnoid hemorrhage, presumably small infarct
with cortical laminar necrosis.
3. Negative for cervical spine fracture.
